# Patient Record
Sex: Male | Born: 1945
Health system: Southern US, Community
[De-identification: ages and names within clinical notes are randomized; demographics above are authoritative.]

## PROBLEM LIST (undated history)

## (undated) DIAGNOSIS — R011 Cardiac murmur, unspecified: Secondary | ICD-10-CM

## (undated) DIAGNOSIS — E785 Hyperlipidemia, unspecified: Secondary | ICD-10-CM

## (undated) DIAGNOSIS — M1990 Unspecified osteoarthritis, unspecified site: Secondary | ICD-10-CM

## (undated) DIAGNOSIS — N529 Male erectile dysfunction, unspecified: Secondary | ICD-10-CM

## (undated) DIAGNOSIS — I2510 Atherosclerotic heart disease of native coronary artery without angina pectoris: Secondary | ICD-10-CM

## (undated) DIAGNOSIS — H269 Unspecified cataract: Secondary | ICD-10-CM

## (undated) DIAGNOSIS — J302 Other seasonal allergic rhinitis: Secondary | ICD-10-CM

## (undated) DIAGNOSIS — M19011 Primary osteoarthritis, right shoulder: Secondary | ICD-10-CM

## (undated) DIAGNOSIS — M19012 Primary osteoarthritis, left shoulder: Secondary | ICD-10-CM

## (undated) DIAGNOSIS — I10 Essential (primary) hypertension: Secondary | ICD-10-CM

## (undated) HISTORY — DX: Atherosclerotic heart disease of native coronary artery without angina pectoris: I25.10

## (undated) HISTORY — DX: Essential (primary) hypertension: I10

## (undated) HISTORY — DX: Male erectile dysfunction, unspecified: N52.9

## (undated) HISTORY — DX: Other seasonal allergic rhinitis: J30.2

## (undated) HISTORY — DX: Unspecified osteoarthritis, unspecified site: M19.90

## (undated) HISTORY — DX: Hyperlipidemia, unspecified: E78.5

## (undated) HISTORY — DX: Primary osteoarthritis, right shoulder: M19.012

## (undated) HISTORY — DX: Primary osteoarthritis, left shoulder: M19.011

## (undated) HISTORY — DX: Unspecified cataract: H26.9

## (undated) HISTORY — PX: HERNIA REPAIR: SHX51

## (undated) HISTORY — DX: Cardiac murmur, unspecified: R01.1

## (undated) HISTORY — PX: POLYPECTOMY: SHX149

---

## 1995-08-22 HISTORY — PX: HERNIA REPAIR: SHX51

## 2006-08-17 ENCOUNTER — Ambulatory Visit: Payer: Self-pay | Admitting: Gastroenterology

## 2006-08-31 ENCOUNTER — Ambulatory Visit: Payer: Self-pay | Admitting: Gastroenterology

## 2009-09-03 ENCOUNTER — Encounter (INDEPENDENT_AMBULATORY_CARE_PROVIDER_SITE_OTHER): Payer: Self-pay | Admitting: *Deleted

## 2009-10-06 ENCOUNTER — Encounter (INDEPENDENT_AMBULATORY_CARE_PROVIDER_SITE_OTHER): Payer: Self-pay | Admitting: *Deleted

## 2009-12-09 ENCOUNTER — Encounter (INDEPENDENT_AMBULATORY_CARE_PROVIDER_SITE_OTHER): Payer: Self-pay | Admitting: *Deleted

## 2009-12-13 ENCOUNTER — Ambulatory Visit: Payer: Self-pay | Admitting: Internal Medicine

## 2009-12-17 ENCOUNTER — Ambulatory Visit: Payer: Self-pay | Admitting: Internal Medicine

## 2009-12-20 ENCOUNTER — Encounter: Payer: Self-pay | Admitting: Internal Medicine

## 2010-09-20 NOTE — Letter (Signed)
Summary: Moviprep Instructions  [facility]  [address]   [place], [place] [address]   Phone: [phone]  Fax: [phone]       Carlos [name]    Dec 01, 1945    MRN: [id number]        Procedure Day Dorna Bloom: Friday, 12-17-09     Arrival Time: 8:30 a.m.      Procedure Time: 9:30 a.m.     Location of Procedure:                    x   [facility] (4th Floor)   PREPARATION FOR COLONOSCOPY WITH MOVIPREP   Starting 5 days prior to your procedure 12-12-09  do not eat nuts, seeds, popcorn, corn, beans, peas,  salads, or any raw vegetables.  Do not take any fiber supplements (e.g. Metamucil, Citrucel, and Benefiber).  THE DAY BEFORE YOUR PROCEDURE         DATE: 12-16-09   DAY: Thursday  1.  Drink clear liquids the entire day-NO SOLID FOOD  2.  Do not drink anything colored red or purple.  Avoid juices with pulp.  No orange juice.  3.  Drink at least 64 oz. (8 glasses) of fluid/clear liquids during the day to prevent dehydration and help the prep work efficiently.  CLEAR LIQUIDS INCLUDE: Water Jello Ice Popsicles Tea (sugar ok, no milk/cream) Powdered fruit flavored drinks Coffee (sugar ok, no milk/cream) Gatorade Juice: apple, white grape, white cranberry  Lemonade Clear bullion, consomm, broth Carbonated beverages (any kind) Strained chicken noodle soup Hard Candy                             4.  In the morning, mix first dose of MoviPrep solution:    Empty 1 Pouch A and 1 Pouch B into the disposable container    Add lukewarm drinking water to the top line of the container. Mix to dissolve    Refrigerate (mixed solution should be used within 24 hrs)  5.  Begin drinking the prep at 5:00 p.m. The MoviPrep container is divided by 4 marks.   Every 15 minutes drink the solution down to the next mark (approximately 8 oz) until the full liter is complete.   6.  Follow completed prep with 16 oz of clear liquid of your choice (Nothing red or  purple).  Continue to drink clear liquids until bedtime.  7.  Before going to bed, mix second dose of MoviPrep solution:    Empty 1 Pouch A and 1 Pouch B into the disposable container    Add lukewarm drinking water to the top line of the container. Mix to dissolve    Refrigerate  THE DAY OF YOUR PROCEDURE      DATE: 12-17-09   DAY: Friday  Beginning at 4:30 a.m. (5 hours before procedure):         1. Every 15 minutes, drink the solution down to the next mark (approx 8 oz) until the full liter is complete.  2. Follow completed prep with 16 oz. of clear liquid of your choice.    3. You may drink clear liquids until 7:30 a.m.   (2 HOURS BEFORE PROCEDURE).   MEDICATION INSTRUCTIONS  Unless otherwise instructed, you should take regular prescription medications with a small sip of water   as early as possible the morning of your procedure.           OTHER INSTRUCTIONS  You will need a responsible adult at least 65 years of age to accompany you and drive you home.   This person must remain in the waiting room during your procedure.  Wear loose fitting clothing that is easily removed.  Leave jewelry and other valuables at home.  However, you may wish to bring a book to read or  an iPod/MP3 player to listen to music as you wait for your procedure to start.  Remove all body piercing jewelry and leave at home.  Total time from sign-in until discharge is approximately 2-3 hours.  You should go home directly after your procedure and rest.  You can resume normal activities the  day after your procedure.  The day of your procedure you should not:   Drive   Make legal decisions   Operate machinery   Drink alcohol   Return to work  You will receive specific instructions about eating, activities and medications before you leave.    The above instructions have been reviewed and explained to me by   Ezra Sites RN  December 13, 2009 9:00 AM     I fully understand and can  verbalize these instructions _____________________________ Date _________

## 2010-09-20 NOTE — Letter (Signed)
Summary: Colonoscopy Letter  [facility]  [address] [place], [place] [address]   Phone: [phone]  Fax: [phone]      September 03, 2009 MRN: [id number]   Carlos [name] [address] [place], [place]  [address]   Dear [name],   According to your medical record, it is time for you to schedule a Colonoscopy. The American Cancer Society recommends this procedure as a method to detect early colon cancer. Patients with a family history of colon cancer, or a personal history of colon polyps or inflammatory bowel disease are at increased risk.  This letter has beeen generated based on the recommendations made at the time of your procedure. If you feel that in your particular situation this may no longer apply, please contact our office.  Please call our office at [phone] to schedule this appointment or to update your records at your earliest convenience.  Thank you for cooperating with [place] to provide you with the very best care possible.   Sincerely,  Wilhemina Bonito. Marina Goodell, M.D.  [facility] [phone]

## 2010-09-20 NOTE — Letter (Signed)
Summary: Patient Notice- Polyp Results  [facility]  [address] [place], [place] [address]   Phone: [phone]  Fax: [phone]        Dec 20, 2009 MRN: [id number]    Carlos [name] [address] [place], [place]  [address]    Dear Mr. Villalona,  I am pleased to inform you that the colon polyp(s) removed during your recent colonoscopy was (were) found to be benign (no cancer detected) upon pathologic examination.  I recommend you have a repeat colonoscopy examination in 3 years to look for recurrent polyps, as having colon polyps increases your risk for having recurrent polyps or even colon cancer in the future.  Should you develop new or worsening symptoms of abdominal pain, bowel habit changes or bleeding from the rectum or bowels, please schedule an evaluation with either your primary care physician or with me.  Additional information/recommendations:  __ No further action with gastroenterology is needed at this time. Please      follow-up with your primary care physician for your other healthcare      needs.   Please call us if you are having persistent problems or have questions about your condition that have not been fully answered at this time.  Sincerely,  Hilarie Fredrickson MD  This letter has been electronically signed by your physician.  Appended Document: Patient Notice- Polyp Results letter mailed 5.5.11

## 2010-09-20 NOTE — Miscellaneous (Signed)
Summary: [facility]  Clinical Lists Changes  Medications: Added new medication of MOVIPREP 100 GM  SOLR (PEG-KCL-NACL-NASULF-NA ASC-C) As per prep instructions. - Signed Rx of MOVIPREP 100 GM  SOLR (PEG-KCL-NACL-NASULF-NA ASC-C) As per prep instructions.;  #1 x 0;  Signed;  Entered by: Ezra Sites RN;  Authorized by: Hilarie Fredrickson MD;  Method used: Electronically to [facility] [phone] [place], [place], [place]  [address], Ph: [phone], Fax: [phone] Observations: Added new observation of NKA: T (12/13/2009 8:29)    Prescriptions: MOVIPREP 100 GM  SOLR (PEG-KCL-NACL-NASULF-NA ASC-C) As per prep instructions.  #1 x 0   Entered by:   Ezra Sites RN   Authorized by:   Hilarie Fredrickson MD   Signed by:   Ezra Sites RN on 12/13/2009   Method used:   Electronically to        [facility] [address] (retail)       [phone]       [place], [place]  [address]       Ph: [phone]       Fax: [phone]   RxID:   [id number]

## 2010-09-20 NOTE — Procedures (Signed)
Summary: Colonoscopy  Patient: Claire Bridge Note: All result statuses are Final unless otherwise noted.  Tests: (1) Colonoscopy (COL)   COL Colonoscopy           DONE     [facility]     [address]     [place], [place]  [address]           COLONOSCOPY PROCEDURE REPORT           PATIENT:  Marcellas, Marchant  MR#:  [id number]     BIRTHDATE:  [birth year], 64 yrs. old  GENDER:  male     ENDOSCOPIST:  Wilhemina Bonito. Eda Keys, MD     REF. BY:  Surveillance Program Recall,     PROCEDURE DATE:  12/17/2009     PROCEDURE:  Colonoscopy with snare polypectomy x 3     ASA CLASS:  Class I     INDICATIONS:  history of pre-cancerous (adenomatous) colon polyps     (7mm adenoma 08-2006), surveillance and high-risk screening     MEDICATIONS:   Fentanyl 75 mcg IV, Versed 7 mg IV           DESCRIPTION OF PROCEDURE:   After the risks benefits and     alternatives of the procedure were thoroughly explained, informed     consent was obtained.  Digital rectal exam was performed and     revealed no abnormalities.   The LB CF-[id number] endoscope     was introduced through the anus and advanced to the cecum, which     was identified by both the appendix and ileocecal valve, without     limitations.Time to cecum = 2:52 min.The quality of the prep was     good, using MoviPrep.  The instrument was then slowly withdrawn     (time = 16:29 min) as the colon was fully examined.     <<PROCEDUREIMAGES>>           FINDINGS:  Three adenomatous appearring polyps were found -     23mm,3mm in the ascending colon and 3mm in the transverse colon.     Polyps were snared without cautery. Retrieval was successful.     Moderate diverticulosis was found in the left colon.Otherwise normal     colonoscopy.   Retroflexed views in the rectum revealed internal     hemorrhoids.    The scope was then withdrawn from the patient and     the procedure completed.           COMPLICATIONS:  None     ENDOSCOPIC IMPRESSION:     1)  Three polyps - removed     2) Moderate diverticulosis in the left colon     3) Internal hemorrhoids           RECOMMENDATIONS:     1) Follow up colonoscopy in 3 years           ______________________________     Wilhemina Bonito. Eda Keys, MD           CC:  The Patient; Guerry Bruin, MD           n.     eSIGNED:   Wilhemina Bonito. Eda Keys at 12/17/2009 10:50 AM           Delrae Sawyers, [id number]  Note: An exclamation mark (!) indicates a result that was not dispersed into the flowsheet. Document Creation Date: 12/17/2009 10:51 AM _______________________________________________________________________  (1) Order result status: Final Collection  or observation date-time: 12/17/2009 10:42 Requested date-time:  Receipt date-time:  Reported date-time:  Referring Physician:   Ordering Physician: Fransico Setters [phone]) Specimen Source:  Source: Launa Grill Order Number: [id number] Lab site:   Appended Document: Colonoscopy recall in 3 yrs/11-2012/aw     Procedures Next Due Date:    Colonoscopy: 11/2012

## 2010-09-20 NOTE — Letter (Signed)
Summary: Previsit letter  [facility]  [address] [place], [place] [address]   Phone: [phone]  Fax: [phone]       10/06/2009 MRN: [id number]  Carlos [name] [address] [place], [place]  [address]  Dear Mr. Thorpe,  Welcome to the Gastroenterology Division at [facility].    You are scheduled to see a nurse for your pre-procedure visit on 12-13-09 at 8:30am on the 3rd floor at [facility], [address]  We ask that you try to arrive at our office 15 minutes prior to your appointment time to allow for check-in.  Your nurse visit will consist of discussing your medical and surgical history, your immediate family medical history, and your medications.    Please bring a complete list of all your medications or, if you prefer, bring the medication bottles and we will list them.  We will need to be aware of both prescribed and over the counter drugs.  We will need to know exact dosage information as well.  If you are on blood thinners (Coumadin, Plavix, Aggrenox, Ticlid, etc.) please call our office today/prior to your appointment, as we need to consult with your physician about holding your medication.   Please be prepared to read and sign documents such as consent forms, a financial agreement, and acknowledgement forms.  If necessary, and with your consent, a friend or relative is welcome to sit-in on the nurse visit with you.  Please bring your insurance card so that we may make a copy of it.  If your insurance requires a referral to see a specialist, please bring your referral form from your primary care physician.  No co-pay is required for this nurse visit.     If you cannot keep your appointment, please call [phone] to cancel or reschedule prior to your appointment date.  This allows [place] the opportunity to schedule an appointment for another patient in need of care.    Thank you for choosing [facility] for your medical  needs.  We appreciate the opportunity to care for you.  Please visit [place] at our website  to learn more about our practice.                     Sincerely.                                                                                                                   The Gastroenterology Division

## 2011-07-31 ENCOUNTER — Encounter: Payer: Self-pay | Admitting: Emergency Medicine

## 2011-07-31 ENCOUNTER — Emergency Department (INDEPENDENT_AMBULATORY_CARE_PROVIDER_SITE_OTHER): Payer: 59

## 2011-07-31 ENCOUNTER — Emergency Department (HOSPITAL_COMMUNITY)
Admission: EM | Admit: 2011-07-31 | Discharge: 2011-07-31 | Disposition: A | Discharge status: Home / Self Care | Payer: 59 | Source: Home / Self Care | Attending: Emergency Medicine | Admitting: Emergency Medicine

## 2011-07-31 DIAGNOSIS — S42293A Other displaced fracture of upper end of unspecified humerus, initial encounter for closed fracture: Secondary | ICD-10-CM

## 2011-07-31 MED ORDER — HYDROCODONE-ACETAMINOPHEN 5-325 MG PO TABS: ORAL_TABLET | ORAL | Status: AC

## 2011-07-31 MED ORDER — DICLOFENAC SODIUM 75 MG PO TBEC: 75.0000 mg | DELAYED_RELEASE_TABLET | Freq: Two times a day (BID) | ORAL | Status: AC

## 2011-07-31 MED ORDER — HYDROCODONE-ACETAMINOPHEN 5-325 MG PO TABS
ORAL_TABLET | ORAL | Status: AC
  Filled 2011-07-31: qty 1

## 2011-07-31 MED ORDER — HYDROCODONE-ACETAMINOPHEN 5-325 MG PO TABS
1.0000 | ORAL_TABLET | Freq: Once | ORAL | Status: AC
  Administered 2011-07-31: 1 via ORAL

## 2011-07-31 NOTE — ED Notes (Signed)
Pt here with right shoulder pain and knot to right head and few abrasions to r hand s/p fall today @ 230.[place] the post office [place] car pulled in and he fell to the ground.denies loc or vomiting.ice applied on admission

## 2011-07-31 NOTE — ED Provider Notes (Signed)
History     CSN: [id number] Arrival date & time: 07/31/2011  6:57 PM   First MD Initiated Contact with Patient 07/31/11 1824      Chief Complaint  Patient presents with  . Shoulder Injury  . Fall  . Head Injury    (Consider location/radiation/quality/duration/timing/severity/associated sxs/prior treatment) HPI Comments: Carlos [name] [place] the post office [place] when he tripped over a car tire and fell on the pavement striking his right shoulder. He also hit his head and his right wrist and there was no loss of consciousness. He has a small abrasion on his right forehead. He denies headaches, diplopia, blurred vision, bleeding from the nose or ears, or any other neurological symptoms. He denies any neck pain and his neck has a full range of motion. He has pain in his right shoulder and its painful with movement. There is no numbness in the arm. He has abrasion over his right radial styloid and over the dorsum of the hand as well but is able to move his wrist and all his fingers without any pain.  Patient is a 65 y.o. male presenting with shoulder injury, fall, and head injury.  Shoulder Injury  Fall Pertinent negatives include no numbness.  Head Injury  Pertinent negatives include no numbness and no weakness.    History reviewed. No pertinent past medical history.  Past Surgical History  Procedure Date  . Hernia repair     History reviewed. No pertinent family history.  History  Substance Use Topics  . Smoking status: Never Smoker   . Smokeless tobacco: Not on file  . Alcohol Use:       Review of Systems  Musculoskeletal: Positive for arthralgias. Negative for myalgias, back pain, joint swelling and gait problem.  Skin: Negative for rash and wound.  Neurological: Negative for weakness and numbness.    Allergies  Review of patient's allergies indicates no known allergies.  Home Medications   Current Outpatient Rx  Name Route Sig Dispense Refill  . DICLOFENAC  SODIUM 75 MG PO TBEC Oral Take 1 tablet (75 mg total) by mouth 2 (two) times daily. 20 tablet 0  . HYDROCODONE-ACETAMINOPHEN 5-325 MG PO TABS  1 to 2 tabs every 4 to 6 hours as needed for pain. 20 tablet 0    BP 155/94  Pulse 106  Temp(Src) 99.3 F (37.4 C) (Oral)  Resp 18  SpO2 99%  Physical Exam  Nursing note and vitals reviewed. Constitutional: He is oriented to person, place, and time. He appears well-developed and well-nourished. No distress.  HENT:  Left Ear: External ear normal.  Nose: Nose normal.  Mouth/Throat: Oropharynx is clear and moist.       Exam of his head reveals an abrasion over the right frontal area. This was mildly tender to palpation. Pupils are equal, round, react to light and he has a full range EOMs.  Eyes: Conjunctivae and EOM are normal. Pupils are equal, round, and reactive to light.  Neck: Normal range of motion. Neck supple.  Musculoskeletal: He exhibits tenderness. He exhibits no edema.       Exam of the shoulder reveals no swelling, bruising, or deformity. There is minimal pain to palpation. He shoulder has a decreased range of motion with pain on movement and he has some abrasions over his right ulnar styloid in the dorsum of the hand but these are very minimal. The wrist fingers all have full range of motion with no pain.  Neurological: He is alert and  oriented to person, place, and time. He has normal strength and normal reflexes. He displays no atrophy and normal reflexes. No cranial nerve deficit or sensory deficit. He exhibits normal muscle tone. Coordination normal.  Skin: Skin is warm and dry. No rash noted. He is not diaphoretic.    ED Course  Procedures (including critical care time)  Labs Reviewed - No data to display Dg Shoulder Right  07/31/2011  *RADIOLOGY REPORT*  Clinical Data: Fall.  Pain.  RIGHT SHOULDER - 2+ VIEW  Comparison: None.  Findings: Comminuted fracture of the right humeral head extending to the right surgical neck.   IMPRESSION: Comminuted fracture of the right humeral head extending to the right surgical neck.  Original Report Authenticated By: Fuller Canada, M.D.   Dg Wrist Complete Right  07/31/2011  *RADIOLOGY REPORT*  Clinical Data: Larey Seat on right side.  Pain right wrist with abrasion on the ulnar side of the right wrist.  RIGHT WRIST - COMPLETE 3+ VIEW  Comparison: None.  Findings: The carpals are located.  No acute fracture is identified.  There is focal soft tissue swelling adjacent to the distal ulna.  IMPRESSION:  1.  No acute fracture identified. 2.  Focal soft tissue swelling adjacent to the distal ulna.  Original Report Authenticated By: Britta Mccreedy, M.D.     1. Humeral head fracture       MDM  He has a right radial head fracture. He was put in a shoulder immobilizer and told to followup with Dr. due to in 2 days.        Roque Lias, MD 07/31/11 2116

## 2013-01-09 ENCOUNTER — Encounter: Payer: Self-pay | Admitting: Internal Medicine

## 2013-03-05 ENCOUNTER — Encounter: Payer: Self-pay | Admitting: Internal Medicine

## 2013-04-29 ENCOUNTER — Ambulatory Visit (AMBULATORY_SURGERY_CENTER): Payer: Self-pay | Admitting: *Deleted

## 2013-04-29 VITALS — Ht 73.0 in | Wt 249.0 lb

## 2013-04-29 DIAGNOSIS — Z86010 Personal history of colonic polyps: Secondary | ICD-10-CM

## 2013-04-29 MED ORDER — MOVIPREP 100 G PO SOLR: 1.0000 | Freq: Once | ORAL | Status: DC

## 2013-04-29 NOTE — Progress Notes (Signed)
Denies allergies to eggs or soy products. Denies complications with anesthesia or sedation. 

## 2013-04-30 ENCOUNTER — Encounter: Payer: Self-pay | Admitting: Internal Medicine

## 2013-05-13 ENCOUNTER — Ambulatory Visit (AMBULATORY_SURGERY_CENTER): Payer: Medicare Other | Admitting: Internal Medicine

## 2013-05-13 ENCOUNTER — Encounter: Payer: Self-pay | Admitting: Internal Medicine

## 2013-05-13 VITALS — BP 147/82 | HR 66 | Temp 98.4°F | Resp 21 | Ht 73.0 in | Wt 249.0 lb

## 2013-05-13 DIAGNOSIS — Z86010 Personal history of colonic polyps: Secondary | ICD-10-CM

## 2013-05-13 DIAGNOSIS — D126 Benign neoplasm of colon, unspecified: Secondary | ICD-10-CM

## 2013-05-13 HISTORY — PX: COLONOSCOPY: SHX174

## 2013-05-13 MED ORDER — SODIUM CHLORIDE 0.9 % IV SOLN: 500.0000 mL | INTRAVENOUS | Status: DC

## 2013-05-13 NOTE — Progress Notes (Signed)
Report to pacu RN, vss, bbs=clear 

## 2013-05-13 NOTE — Progress Notes (Signed)
Called to room to assist during endoscopic procedure.  Patient ID and intended procedure confirmed with present staff. Received instructions for my participation in the procedure from the performing physician.  

## 2013-05-13 NOTE — Progress Notes (Signed)
No complaints noted in the recovery room. Maw   

## 2013-05-13 NOTE — Patient Instructions (Addendum)
YOU HAD AN ENDOSCOPIC PROCEDURE TODAY AT THE [facility]: Refer to the procedure report that was given to you for any specific questions about what was found during the examination.  If the procedure report does not answer your questions, please call your gastroenterologist to clarify.  If you requested that your care partner not be given the details of your procedure findings, then the procedure report has been included in a sealed envelope for you to review at your convenience later.  YOU SHOULD EXPECT: Some feelings of bloating in the abdomen. Passage of more gas than usual.  Walking can help get rid of the air that was put into your GI tract during the procedure and reduce the bloating. If you had a lower endoscopy (such as a colonoscopy or flexible sigmoidoscopy) you may notice spotting of blood in your stool or on the toilet paper. If you underwent a bowel prep for your procedure, then you may not have a normal bowel movement for a few days.  DIET: Your first meal following the procedure should be a light meal and then it is ok to progress to your normal diet.  A half-sandwich or bowl of soup is an example of a good first meal.  Heavy or fried foods are harder to digest and may make you feel nauseous or bloated.  Likewise meals heavy in dairy and vegetables can cause extra gas to form and this can also increase the bloating.  Drink plenty of fluids but you should avoid alcoholic beverages for 24 hours.  ACTIVITY: Your care partner should take you home directly after the procedure.  You should plan to take it easy, moving slowly for the rest of the day.  You can resume normal activity the day after the procedure however you should NOT DRIVE or use heavy machinery for 24 hours (because of the sedation medicines used during the test).    SYMPTOMS TO REPORT IMMEDIATELY: A gastroenterologist can be reached at any hour.  During normal business hours, 8:30 AM to 5:00 PM Monday through Friday,  call [phone].  After hours and on weekends, please call the GI answering service at [phone] who will take a message and have the physician on call contact you.   Following lower endoscopy (colonoscopy or flexible sigmoidoscopy):  Excessive amounts of blood in the stool  Significant tenderness or worsening of abdominal pains  Swelling of the abdomen that is new, acute  Fever of 100F or higher   FOLLOW UP: If any biopsies were taken you will be contacted by phone or by letter within the next 1-3 weeks.  Call your gastroenterologist if you have not heard about the biopsies in 3 weeks.  Our staff will call the home number listed on your records the next business day following your procedure to check on you and address any questions or concerns that you may have at that time regarding the information given to you following your procedure. This is a courtesy call and so if there is no answer at the home number and we have not heard from you through the emergency physician on call, we will assume that you have returned to your regular daily activities without incident.  SIGNATURES/CONFIDENTIALITY: You and/or your care partner have signed paperwork which will be entered into your electronic medical record.  These signatures attest to the fact that that the information above on your After Visit Summary has been reviewed and is understood.  Full responsibility of the confidentiality of   this discharge information lies with you and/or your care-partner.    Handouts were given to your care partner on polyps, diverticulosis and a high fiber diet. Lamisil cream (OTC) twice daily to buttock rash per Dr. Marina Goodell. You may resume your current medications today. Please call if any questions or concerns.

## 2013-05-13 NOTE — Op Note (Signed)
[facility] [address] [place], [address]   COLONOSCOPY PROCEDURE REPORT  PATIENT: Carlos [name], Carlos [name]  MR#: [id number] BIRTHDATE: 24-Feb-1946 , 67  yrs. old GENDER: Male ENDOSCOPIST: Roxy Cedar, MD REFERRED [id number] Program Recall PROCEDURE DATE:  05/13/2013 PROCEDURE:   Colonoscopy with snare polypectomy x 2 First Screening Colonoscopy - Avg.  risk and is 50 yrs.  old or older - No.  Prior Negative Screening - Now for repeat screening. N/A  History of Adenoma - Now for follow-up colonoscopy & has been > or = to 3 yrs.  Yes hx of adenoma.  Has been 3 or more years since last colonoscopy.  Polyps Removed Today? Yes. ASA CLASS:   Class II INDICATIONS:Patient's personal history of adenomatous colon polyps. Index 2006 (7mm TA); 11-2009 (TA x 3) MEDICATIONS: MAC sedation, administered by CRNA and propofol (Diprivan) 300mg  IV  DESCRIPTION OF PROCEDURE:   After the risks benefits and alternatives of the procedure were thoroughly explained, informed consent was obtained.  A digital rectal exam revealed no abnormalities of the rectum.   The LB [id number]  endoscope was introduced through the anus and advanced to the cecum, which was identified by both the appendix and ileocecal valve. No adverse events experienced.   The quality of the prep was excellent, using MoviPrep  The instrument was then slowly withdrawn as the colon was fully examined.   COLON FINDINGS: Two diminutive polyps were found in the transverse colon.  A polypectomy was performed with a cold snare.  The resection was complete and the polyp tissue was completely retrieved.   Moderate diverticulosis was noted in the sigmoid colon.   The colon mucosa was otherwise normal.  Retroflexed views revealed internal hemorrhoids. The time to cecum=5 minutes 46 seconds.  Withdrawal time=10 minutes 31 seconds.  The scope was withdrawn and the procedure completed. COMPLICATIONS: There were no  complications.  ENDOSCOPIC IMPRESSION: 1.   Two diminutive polyps were found in the transverse colon; polypectomy was performed with a cold snare 2.   Moderate diverticulosis was noted in the sigmoid colon 3.   The colon mucosa was otherwise normal  RECOMMENDATIONS: 1. Follow up colonoscopy in 5 years 2. Lamisil cream (can obtain OTC) twice daily to buttock rash   eSigned:  Roxy Cedar, MD 05/13/2013 9:51 AM   cc: Guerry Bruin, MD and The Patient   PATIENT NAME:  Carlos [name], Carlos [name] MR#: [id number]

## 2013-05-14 ENCOUNTER — Telehealth: Payer: Self-pay | Admitting: *Deleted

## 2013-05-14 NOTE — Telephone Encounter (Signed)
  Follow up Call-  Call back number 05/13/2013  Post procedure Call Back phone  # [phone]  Permission to leave phone message Yes     Patient questions:  Do you have a fever, pain , or abdominal swelling? no Pain Score  0 *  Have you tolerated food without any problems? yes  Have you been able to return to your normal activities? yes  Do you have any questions about your discharge instructions: Diet   no Medications  no Follow up visit  no  Do you have questions or concerns about your Care? no  Actions: * If pain score is 4 or above: No action needed, pain <4.

## 2013-05-19 ENCOUNTER — Encounter: Payer: Self-pay | Admitting: Internal Medicine

## 2014-11-03 ENCOUNTER — Other Ambulatory Visit: Payer: Self-pay | Admitting: Physician Assistant

## 2015-07-06 DIAGNOSIS — Z23 Encounter for immunization: Secondary | ICD-10-CM | POA: Diagnosis not present

## 2015-08-03 DIAGNOSIS — E785 Hyperlipidemia, unspecified: Secondary | ICD-10-CM | POA: Diagnosis not present

## 2015-08-03 DIAGNOSIS — Z0000 Encounter for general adult medical examination without abnormal findings: Secondary | ICD-10-CM | POA: Diagnosis not present

## 2015-08-03 DIAGNOSIS — E7800 Pure hypercholesterolemia, unspecified: Secondary | ICD-10-CM | POA: Diagnosis not present

## 2015-08-10 ENCOUNTER — Encounter: Payer: Self-pay | Admitting: Gastroenterology

## 2015-08-10 DIAGNOSIS — E668 Other obesity: Secondary | ICD-10-CM | POA: Diagnosis not present

## 2015-08-10 DIAGNOSIS — D126 Benign neoplasm of colon, unspecified: Secondary | ICD-10-CM | POA: Diagnosis not present

## 2015-08-10 DIAGNOSIS — Z0000 Encounter for general adult medical examination without abnormal findings: Secondary | ICD-10-CM | POA: Diagnosis not present

## 2015-08-10 DIAGNOSIS — M160 Bilateral primary osteoarthritis of hip: Secondary | ICD-10-CM | POA: Diagnosis not present

## 2015-08-10 DIAGNOSIS — R69 Illness, unspecified: Secondary | ICD-10-CM | POA: Diagnosis not present

## 2015-08-10 DIAGNOSIS — Z6832 Body mass index (BMI) 32.0-32.9, adult: Secondary | ICD-10-CM | POA: Diagnosis not present

## 2015-08-10 DIAGNOSIS — E7800 Pure hypercholesterolemia, unspecified: Secondary | ICD-10-CM | POA: Diagnosis not present

## 2015-08-10 DIAGNOSIS — Z1389 Encounter for screening for other disorder: Secondary | ICD-10-CM | POA: Diagnosis not present

## 2015-08-18 DIAGNOSIS — Z1212 Encounter for screening for malignant neoplasm of rectum: Secondary | ICD-10-CM | POA: Diagnosis not present

## 2015-10-20 DIAGNOSIS — H11442 Conjunctival cysts, left eye: Secondary | ICD-10-CM | POA: Diagnosis not present

## 2015-11-15 DIAGNOSIS — R69 Illness, unspecified: Secondary | ICD-10-CM | POA: Diagnosis not present

## 2015-12-28 DIAGNOSIS — H25013 Cortical age-related cataract, bilateral: Secondary | ICD-10-CM | POA: Diagnosis not present

## 2015-12-28 DIAGNOSIS — Z0100 Encounter for examination of eyes and vision without abnormal findings: Secondary | ICD-10-CM | POA: Diagnosis not present

## 2016-01-04 DIAGNOSIS — R69 Illness, unspecified: Secondary | ICD-10-CM | POA: Diagnosis not present

## 2016-05-24 ENCOUNTER — Other Ambulatory Visit: Payer: Self-pay | Admitting: Physician Assistant

## 2016-05-24 DIAGNOSIS — L820 Inflamed seborrheic keratosis: Secondary | ICD-10-CM | POA: Diagnosis not present

## 2016-05-24 DIAGNOSIS — D485 Neoplasm of uncertain behavior of skin: Secondary | ICD-10-CM | POA: Diagnosis not present

## 2016-05-24 DIAGNOSIS — L821 Other seborrheic keratosis: Secondary | ICD-10-CM | POA: Diagnosis not present

## 2016-05-24 DIAGNOSIS — L432 Lichenoid drug reaction: Secondary | ICD-10-CM | POA: Diagnosis not present

## 2016-05-31 DIAGNOSIS — J029 Acute pharyngitis, unspecified: Secondary | ICD-10-CM | POA: Diagnosis not present

## 2016-05-31 DIAGNOSIS — Z6831 Body mass index (BMI) 31.0-31.9, adult: Secondary | ICD-10-CM | POA: Diagnosis not present

## 2016-06-01 DIAGNOSIS — Z23 Encounter for immunization: Secondary | ICD-10-CM | POA: Diagnosis not present

## 2016-06-22 ENCOUNTER — Other Ambulatory Visit: Payer: Self-pay | Admitting: Physician Assistant

## 2016-06-22 DIAGNOSIS — D485 Neoplasm of uncertain behavior of skin: Secondary | ICD-10-CM | POA: Diagnosis not present

## 2016-06-22 DIAGNOSIS — L821 Other seborrheic keratosis: Secondary | ICD-10-CM | POA: Diagnosis not present

## 2016-06-22 DIAGNOSIS — L570 Actinic keratosis: Secondary | ICD-10-CM | POA: Diagnosis not present

## 2016-08-04 DIAGNOSIS — E7800 Pure hypercholesterolemia, unspecified: Secondary | ICD-10-CM | POA: Diagnosis not present

## 2016-08-04 DIAGNOSIS — Z125 Encounter for screening for malignant neoplasm of prostate: Secondary | ICD-10-CM | POA: Diagnosis not present

## 2016-08-04 DIAGNOSIS — R8299 Other abnormal findings in urine: Secondary | ICD-10-CM | POA: Diagnosis not present

## 2016-08-11 DIAGNOSIS — Z1389 Encounter for screening for other disorder: Secondary | ICD-10-CM | POA: Diagnosis not present

## 2016-08-11 DIAGNOSIS — M160 Bilateral primary osteoarthritis of hip: Secondary | ICD-10-CM | POA: Diagnosis not present

## 2016-08-11 DIAGNOSIS — D101 Benign neoplasm of tongue: Secondary | ICD-10-CM | POA: Diagnosis not present

## 2016-08-11 DIAGNOSIS — Z6831 Body mass index (BMI) 31.0-31.9, adult: Secondary | ICD-10-CM | POA: Diagnosis not present

## 2016-08-11 DIAGNOSIS — E7800 Pure hypercholesterolemia, unspecified: Secondary | ICD-10-CM | POA: Diagnosis not present

## 2016-08-11 DIAGNOSIS — R69 Illness, unspecified: Secondary | ICD-10-CM | POA: Diagnosis not present

## 2016-08-11 DIAGNOSIS — Z23 Encounter for immunization: Secondary | ICD-10-CM | POA: Diagnosis not present

## 2016-08-11 DIAGNOSIS — R3129 Other microscopic hematuria: Secondary | ICD-10-CM | POA: Diagnosis not present

## 2016-08-11 DIAGNOSIS — Z0000 Encounter for general adult medical examination without abnormal findings: Secondary | ICD-10-CM | POA: Diagnosis not present

## 2016-08-11 DIAGNOSIS — E668 Other obesity: Secondary | ICD-10-CM | POA: Diagnosis not present

## 2016-08-11 DIAGNOSIS — J3089 Other allergic rhinitis: Secondary | ICD-10-CM | POA: Diagnosis not present

## 2016-08-16 DIAGNOSIS — Z1212 Encounter for screening for malignant neoplasm of rectum: Secondary | ICD-10-CM | POA: Diagnosis not present

## 2016-09-12 DIAGNOSIS — D101 Benign neoplasm of tongue: Secondary | ICD-10-CM | POA: Diagnosis not present

## 2016-09-12 DIAGNOSIS — R69 Illness, unspecified: Secondary | ICD-10-CM | POA: Diagnosis not present

## 2016-09-19 DIAGNOSIS — D101 Benign neoplasm of tongue: Secondary | ICD-10-CM | POA: Diagnosis not present

## 2016-11-01 DIAGNOSIS — H02055 Trichiasis without entropian left lower eyelid: Secondary | ICD-10-CM | POA: Diagnosis not present

## 2016-11-01 DIAGNOSIS — H25013 Cortical age-related cataract, bilateral: Secondary | ICD-10-CM | POA: Diagnosis not present

## 2016-11-01 DIAGNOSIS — H2513 Age-related nuclear cataract, bilateral: Secondary | ICD-10-CM | POA: Diagnosis not present

## 2016-11-01 DIAGNOSIS — H524 Presbyopia: Secondary | ICD-10-CM | POA: Diagnosis not present

## 2017-05-19 DIAGNOSIS — Z23 Encounter for immunization: Secondary | ICD-10-CM | POA: Diagnosis not present

## 2017-08-31 DIAGNOSIS — E7800 Pure hypercholesterolemia, unspecified: Secondary | ICD-10-CM | POA: Diagnosis not present

## 2017-08-31 DIAGNOSIS — R82998 Other abnormal findings in urine: Secondary | ICD-10-CM | POA: Diagnosis not present

## 2017-08-31 DIAGNOSIS — Z125 Encounter for screening for malignant neoplasm of prostate: Secondary | ICD-10-CM | POA: Diagnosis not present

## 2017-09-06 DIAGNOSIS — Z0000 Encounter for general adult medical examination without abnormal findings: Secondary | ICD-10-CM | POA: Diagnosis not present

## 2017-09-06 DIAGNOSIS — R69 Illness, unspecified: Secondary | ICD-10-CM | POA: Diagnosis not present

## 2017-09-06 DIAGNOSIS — Z6833 Body mass index (BMI) 33.0-33.9, adult: Secondary | ICD-10-CM | POA: Diagnosis not present

## 2017-09-06 DIAGNOSIS — E7800 Pure hypercholesterolemia, unspecified: Secondary | ICD-10-CM | POA: Diagnosis not present

## 2017-09-06 DIAGNOSIS — M160 Bilateral primary osteoarthritis of hip: Secondary | ICD-10-CM | POA: Diagnosis not present

## 2017-09-06 DIAGNOSIS — E668 Other obesity: Secondary | ICD-10-CM | POA: Diagnosis not present

## 2017-09-06 DIAGNOSIS — D126 Benign neoplasm of colon, unspecified: Secondary | ICD-10-CM | POA: Diagnosis not present

## 2017-09-06 DIAGNOSIS — J3089 Other allergic rhinitis: Secondary | ICD-10-CM | POA: Diagnosis not present

## 2017-09-06 DIAGNOSIS — Z1389 Encounter for screening for other disorder: Secondary | ICD-10-CM | POA: Diagnosis not present

## 2017-09-14 DIAGNOSIS — Z1212 Encounter for screening for malignant neoplasm of rectum: Secondary | ICD-10-CM | POA: Diagnosis not present

## 2017-11-06 DIAGNOSIS — H2513 Age-related nuclear cataract, bilateral: Secondary | ICD-10-CM | POA: Diagnosis not present

## 2017-11-06 DIAGNOSIS — H52203 Unspecified astigmatism, bilateral: Secondary | ICD-10-CM | POA: Diagnosis not present

## 2017-11-06 DIAGNOSIS — H25013 Cortical age-related cataract, bilateral: Secondary | ICD-10-CM | POA: Diagnosis not present

## 2017-11-06 DIAGNOSIS — H5213 Myopia, bilateral: Secondary | ICD-10-CM | POA: Diagnosis not present

## 2018-03-28 DIAGNOSIS — R69 Illness, unspecified: Secondary | ICD-10-CM | POA: Diagnosis not present

## 2018-04-01 DIAGNOSIS — R69 Illness, unspecified: Secondary | ICD-10-CM | POA: Diagnosis not present

## 2018-05-18 DIAGNOSIS — Z23 Encounter for immunization: Secondary | ICD-10-CM | POA: Diagnosis not present

## 2018-07-30 ENCOUNTER — Other Ambulatory Visit: Payer: Self-pay | Admitting: Dermatology

## 2018-07-30 DIAGNOSIS — D485 Neoplasm of uncertain behavior of skin: Secondary | ICD-10-CM | POA: Diagnosis not present

## 2018-07-30 DIAGNOSIS — L820 Inflamed seborrheic keratosis: Secondary | ICD-10-CM | POA: Diagnosis not present

## 2018-07-30 DIAGNOSIS — L309 Dermatitis, unspecified: Secondary | ICD-10-CM | POA: Diagnosis not present

## 2018-07-30 DIAGNOSIS — D229 Melanocytic nevi, unspecified: Secondary | ICD-10-CM | POA: Diagnosis not present

## 2018-07-30 DIAGNOSIS — L219 Seborrheic dermatitis, unspecified: Secondary | ICD-10-CM | POA: Diagnosis not present

## 2018-09-05 DIAGNOSIS — R82998 Other abnormal findings in urine: Secondary | ICD-10-CM | POA: Diagnosis not present

## 2018-09-05 DIAGNOSIS — Z125 Encounter for screening for malignant neoplasm of prostate: Secondary | ICD-10-CM | POA: Diagnosis not present

## 2018-09-05 DIAGNOSIS — E7800 Pure hypercholesterolemia, unspecified: Secondary | ICD-10-CM | POA: Diagnosis not present

## 2018-09-12 DIAGNOSIS — E668 Other obesity: Secondary | ICD-10-CM | POA: Diagnosis not present

## 2018-09-12 DIAGNOSIS — R69 Illness, unspecified: Secondary | ICD-10-CM | POA: Diagnosis not present

## 2018-09-12 DIAGNOSIS — M160 Bilateral primary osteoarthritis of hip: Secondary | ICD-10-CM | POA: Diagnosis not present

## 2018-09-12 DIAGNOSIS — D126 Benign neoplasm of colon, unspecified: Secondary | ICD-10-CM | POA: Diagnosis not present

## 2018-09-12 DIAGNOSIS — Z1331 Encounter for screening for depression: Secondary | ICD-10-CM | POA: Diagnosis not present

## 2018-09-12 DIAGNOSIS — J3089 Other allergic rhinitis: Secondary | ICD-10-CM | POA: Diagnosis not present

## 2018-09-12 DIAGNOSIS — Z1339 Encounter for screening examination for other mental health and behavioral disorders: Secondary | ICD-10-CM | POA: Diagnosis not present

## 2018-09-12 DIAGNOSIS — H6121 Impacted cerumen, right ear: Secondary | ICD-10-CM | POA: Diagnosis not present

## 2018-09-12 DIAGNOSIS — Z0000 Encounter for general adult medical examination without abnormal findings: Secondary | ICD-10-CM | POA: Diagnosis not present

## 2018-09-12 DIAGNOSIS — E7800 Pure hypercholesterolemia, unspecified: Secondary | ICD-10-CM | POA: Diagnosis not present

## 2018-09-13 DIAGNOSIS — Z1212 Encounter for screening for malignant neoplasm of rectum: Secondary | ICD-10-CM | POA: Diagnosis not present

## 2018-10-07 DIAGNOSIS — R69 Illness, unspecified: Secondary | ICD-10-CM | POA: Diagnosis not present

## 2018-10-09 ENCOUNTER — Encounter: Payer: Self-pay | Admitting: Internal Medicine

## 2018-10-21 ENCOUNTER — Ambulatory Visit (AMBULATORY_SURGERY_CENTER): Payer: Self-pay | Admitting: *Deleted

## 2018-10-21 ENCOUNTER — Encounter: Payer: Self-pay | Admitting: Internal Medicine

## 2018-10-21 VITALS — Ht 73.0 in | Wt 250.0 lb

## 2018-10-21 DIAGNOSIS — Z86010 Personal history of colonic polyps: Secondary | ICD-10-CM

## 2018-10-21 MED ORDER — NA SULFATE-K SULFATE-MG SULF 17.5-3.13-1.6 GM/177ML PO SOLN
ORAL | 0 refills | Status: DC
Start: 2018-10-21 — End: 2018-11-04

## 2018-10-21 NOTE — Progress Notes (Signed)
Patient denies any allergies to eggs or soy. Patient denies any problems with anesthesia/sedation. Patient denies any oxygen use at home. Patient denies taking any diet/weight loss medications or blood thinners. EMMI education offered, pt declined.  

## 2018-11-04 ENCOUNTER — Other Ambulatory Visit: Payer: Self-pay

## 2018-11-04 ENCOUNTER — Encounter: Payer: Self-pay | Admitting: Internal Medicine

## 2018-11-04 ENCOUNTER — Ambulatory Visit (AMBULATORY_SURGERY_CENTER): Payer: Medicare HMO | Admitting: Internal Medicine

## 2018-11-04 VITALS — BP 134/78 | HR 80 | Resp 16 | Ht 73.0 in | Wt 250.0 lb

## 2018-11-04 DIAGNOSIS — D122 Benign neoplasm of ascending colon: Secondary | ICD-10-CM

## 2018-11-04 DIAGNOSIS — Z86010 Personal history of colonic polyps: Secondary | ICD-10-CM

## 2018-11-04 DIAGNOSIS — Z1211 Encounter for screening for malignant neoplasm of colon: Secondary | ICD-10-CM | POA: Diagnosis not present

## 2018-11-04 MED ORDER — SODIUM CHLORIDE 0.9 % IV SOLN: 500.0000 mL | Freq: Once | INTRAVENOUS | Status: DC

## 2018-11-04 NOTE — Progress Notes (Signed)
Pt's states no medical or surgical changes since previsit or office visit. 

## 2018-11-04 NOTE — Progress Notes (Signed)
No problems noted in the recovery room. maw 

## 2018-11-04 NOTE — Progress Notes (Signed)
A and O x3. Report to RN. Tolerated MAC anesthesia well.

## 2018-11-04 NOTE — Progress Notes (Signed)
Called to room to assist during endoscopic procedure.  Patient ID and intended procedure confirmed with present staff. Received instructions for my participation in the procedure from the performing physician.  

## 2018-11-04 NOTE — Patient Instructions (Signed)
YOU HAD AN ENDOSCOPIC PROCEDURE TODAY AT [facility]:   Refer to the procedure report that was given to you for any specific questions about what was found during the examination.  If the procedure report does not answer your questions, please call your gastroenterologist to clarify.  If you requested that your care partner not be given the details of your procedure findings, then the procedure report has been included in a sealed envelope for you to review at your convenience later.  YOU SHOULD EXPECT: Some feelings of bloating in the abdomen. Passage of more gas than usual.  Walking can help get rid of the air that was put into your GI tract during the procedure and reduce the bloating. If you had a lower endoscopy (such as a colonoscopy or flexible sigmoidoscopy) you may notice spotting of blood in your stool or on the toilet paper. If you underwent a bowel prep for your procedure, you may not have a normal bowel movement for a few days.  Please Note:  You might notice some irritation and congestion in your nose or some drainage.  This is from the oxygen used during your procedure.  There is no need for concern and it should clear up in a day or so.  SYMPTOMS TO REPORT IMMEDIATELY:   Following lower endoscopy (colonoscopy or flexible sigmoidoscopy):  Excessive amounts of blood in the stool  Significant tenderness or worsening of abdominal pains  Swelling of the abdomen that is new, acute  Fever of 100F or higher    For urgent or emergent issues, a gastroenterologist can be reached at any hour by calling [phone].   DIET:  We do recommend a small meal at first, but then you may proceed to your regular diet.  Drink plenty of fluids but you should avoid alcoholic beverages for 24 hours.  ACTIVITY:  You should plan to take it easy for the rest of today and you should NOT DRIVE or use heavy machinery until tomorrow (because of the sedation medicines used during the test).     FOLLOW UP: Our staff will call the number listed on your records the next business day following your procedure to check on you and address any questions or concerns that you may have regarding the information given to you following your procedure. If we do not reach you, we will leave a message.  However, if you are feeling well and you are not experiencing any problems, there is no need to return our call.  We will assume that you have returned to your regular daily activities without incident.  If any biopsies were taken you will be contacted by phone or by letter within the next 1-3 weeks.  Please call us at [phone] if you have not heard about the biopsies in 3 weeks.    SIGNATURES/CONFIDENTIALITY: You and/or your care partner have signed paperwork which will be entered into your electronic medical record.  These signatures attest to the fact that that the information above on your After Visit Summary has been reviewed and is understood.  Full responsibility of the confidentiality of this discharge information lies with you and/or your care-partner.     Handouts were given to your care partner on polyps, diverticulosis, and hemorrhoids. You may resume your current medications today. Await biopsy results. Repeat colonoscopy in 5 years. Please call if any questions or concerns.

## 2018-11-04 NOTE — Op Note (Signed)
[facility] Patient Name: Carlos [name] Procedure Date: 11/04/2018 9:34 AM MRN: [id number] Endoscopist: Docia Chuck. Henrene Pastor , MD Age: 73 Referring MD:  Date of Birth: 02-Apr-1946 Gender: Male Account #: [id number] Procedure:                Colonoscopy with cold snare polypectomy x 2 Indications:              High risk colon cancer surveillance: Personal                            history of multiple (3 or more) adenomas. Previous                            examinations 2006, 2008, 2011, 2014 Medicines:                Monitored Anesthesia Care Procedure:                Pre-Anesthesia Assessment:                           - Prior to the procedure, a History and Physical                            was performed, and patient medications and                            allergies were reviewed. The patient's tolerance of                            previous anesthesia was also reviewed. The risks                            and benefits of the procedure and the sedation                            options and risks were discussed with the patient.                            All questions were answered, and informed consent                            was obtained. Prior Anticoagulants: The patient has                            taken no previous anticoagulant or antiplatelet                            agents. ASA Grade Assessment: II - A patient with                            mild systemic disease. After reviewing the risks                            and benefits, the patient was deemed in  satisfactory condition to undergo the procedure.                           After obtaining informed consent, the colonoscope                            was passed under direct vision. Throughout the                            procedure, the patient's blood pressure, pulse, and                            oxygen saturations were monitored continuously. The    Colonoscope was introduced through the anus and                            advanced to the the cecum, identified by                            appendiceal orifice and ileocecal valve. The                            ileocecal valve, appendiceal orifice, and rectum                            were photographed. The quality of the bowel                            preparation was good. The colonoscopy was performed                            without difficulty. The patient tolerated the                            procedure well. The bowel preparation used was                            SUPREP. Scope In: 9:42:50 AM Scope Out: 9:57:32 AM Scope Withdrawal Time: 0 hours 10 minutes 44 seconds  Total Procedure Duration: 0 hours 14 minutes 42 seconds  Findings:                 Two polyps were found in the ascending colon. The                            polyps were 2 to 3 mm in size. These polyps were                            removed with a cold snare. Resection and retrieval                            were complete.                           Multiple diverticula were found in the  left colon.                           Internal hemorrhoids were found during retroflexion.                           The exam was otherwise without abnormality on                            direct and retroflexion views. Complications:            No immediate complications. Estimated blood loss:                            None. Estimated Blood Loss:     Estimated blood loss: none. Impression:               - Two 2 to 3 mm polyps in the ascending colon,                            removed with a cold snare. Resected and retrieved.                           - Diverticulosis in the left colon.                           - Internal hemorrhoids.                           - The examination was otherwise normal on direct                            and retroflexion views. Recommendation:           - Repeat colonoscopy in 5 years  for surveillance.                           - Patient has a contact number available for                            emergencies. The signs and symptoms of potential                            delayed complications were discussed with the                            patient. Return to normal activities tomorrow.                            Written discharge instructions were provided to the                            patient.                           - Resume previous diet.                           -  Continue present medications.                           - Await pathology results. Docia Chuck. Henrene Pastor, MD 11/04/2018 10:03:00 AM This report has been signed electronically.

## 2018-11-05 ENCOUNTER — Telehealth: Payer: Self-pay | Admitting: *Deleted

## 2018-11-05 NOTE — Telephone Encounter (Signed)
  Follow up Call-  Call back number 11/04/2018  Post procedure Call Back phone  # [phone]  Permission to leave phone message Yes  Some recent data might be hidden     Patient questions:  Do you have a fever, pain , or abdominal swelling? No. Pain Score  0 *  Have you tolerated food without any problems? Yes.    Have you been able to return to your normal activities? Yes.    Do you have any questions about your discharge instructions: Diet   No. Medications  No. Follow up visit  No.  Do you have questions or concerns about your Care? No.  Actions: * If pain score is 4 or above: No action needed, pain <4.

## 2018-11-05 NOTE — Telephone Encounter (Signed)
First follow up call attempt.  No answer or voicemail option. °

## 2018-11-06 ENCOUNTER — Encounter: Payer: Self-pay | Admitting: Internal Medicine

## 2018-11-08 DIAGNOSIS — Z86010 Personal history of colonic polyps: Secondary | ICD-10-CM

## 2018-11-08 DIAGNOSIS — Z860101 Personal history of adenomatous and serrated colon polyps: Secondary | ICD-10-CM

## 2018-11-08 HISTORY — DX: Personal history of adenomatous and serrated colon polyps: Z86.0101

## 2018-11-08 HISTORY — DX: Personal history of colonic polyps: Z86.010

## 2019-01-10 DIAGNOSIS — H524 Presbyopia: Secondary | ICD-10-CM | POA: Diagnosis not present

## 2019-01-10 DIAGNOSIS — H25013 Cortical age-related cataract, bilateral: Secondary | ICD-10-CM | POA: Diagnosis not present

## 2019-01-10 DIAGNOSIS — H2513 Age-related nuclear cataract, bilateral: Secondary | ICD-10-CM | POA: Diagnosis not present

## 2019-01-10 DIAGNOSIS — H43813 Vitreous degeneration, bilateral: Secondary | ICD-10-CM | POA: Diagnosis not present

## 2019-04-08 DIAGNOSIS — R69 Illness, unspecified: Secondary | ICD-10-CM | POA: Diagnosis not present

## 2019-05-22 DIAGNOSIS — Z23 Encounter for immunization: Secondary | ICD-10-CM | POA: Diagnosis not present

## 2019-09-10 DIAGNOSIS — Z125 Encounter for screening for malignant neoplasm of prostate: Secondary | ICD-10-CM | POA: Diagnosis not present

## 2019-09-10 DIAGNOSIS — E7800 Pure hypercholesterolemia, unspecified: Secondary | ICD-10-CM | POA: Diagnosis not present

## 2019-09-15 DIAGNOSIS — R82998 Other abnormal findings in urine: Secondary | ICD-10-CM | POA: Diagnosis not present

## 2019-09-16 DIAGNOSIS — Z1212 Encounter for screening for malignant neoplasm of rectum: Secondary | ICD-10-CM | POA: Diagnosis not present

## 2019-09-17 DIAGNOSIS — J309 Allergic rhinitis, unspecified: Secondary | ICD-10-CM | POA: Diagnosis not present

## 2019-09-17 DIAGNOSIS — E669 Obesity, unspecified: Secondary | ICD-10-CM | POA: Diagnosis not present

## 2019-09-17 DIAGNOSIS — D126 Benign neoplasm of colon, unspecified: Secondary | ICD-10-CM | POA: Diagnosis not present

## 2019-09-17 DIAGNOSIS — E7800 Pure hypercholesterolemia, unspecified: Secondary | ICD-10-CM | POA: Diagnosis not present

## 2019-09-17 DIAGNOSIS — M160 Bilateral primary osteoarthritis of hip: Secondary | ICD-10-CM | POA: Diagnosis not present

## 2019-09-17 DIAGNOSIS — Z1339 Encounter for screening examination for other mental health and behavioral disorders: Secondary | ICD-10-CM | POA: Diagnosis not present

## 2019-09-17 DIAGNOSIS — Z0000 Encounter for general adult medical examination without abnormal findings: Secondary | ICD-10-CM | POA: Diagnosis not present

## 2019-09-17 DIAGNOSIS — R69 Illness, unspecified: Secondary | ICD-10-CM | POA: Diagnosis not present

## 2019-09-17 DIAGNOSIS — Z1331 Encounter for screening for depression: Secondary | ICD-10-CM | POA: Diagnosis not present

## 2019-10-15 DIAGNOSIS — R69 Illness, unspecified: Secondary | ICD-10-CM | POA: Diagnosis not present

## 2019-10-23 ENCOUNTER — Ambulatory Visit: Payer: Self-pay

## 2019-10-26 ENCOUNTER — Ambulatory Visit: Payer: Medicare HMO | Attending: Internal Medicine

## 2019-10-26 ENCOUNTER — Other Ambulatory Visit: Payer: Self-pay

## 2019-10-26 DIAGNOSIS — Z23 Encounter for immunization: Secondary | ICD-10-CM | POA: Insufficient documentation

## 2019-10-26 NOTE — Progress Notes (Signed)
   Covid-19 Vaccination Clinic  Name:  Carlos [name]    MRN: [id number] DOB: [birth year]  10/26/2019  [name] was observed post Covid-19 immunization for 15 minutes without incident. He was provided with Vaccine Information Sheet and instruction to access the V-Safe system.   [name] was instructed to call 911 with any severe reactions post vaccine: Marland Kitchen Difficulty breathing  . Swelling of face and throat  . A fast heartbeat  . A bad rash all over body  . Dizziness and weakness   Immunizations Administered    Name Date Dose VIS Date Route   Pfizer COVID-19 Vaccine 10/26/2019 12:27 PM 0.3 mL 08/01/2019 Intramuscular   Manufacturer: [place]   Lot: [id number]   [place]: [id number]

## 2019-12-02 ENCOUNTER — Ambulatory Visit: Payer: Medicare HMO | Attending: Internal Medicine

## 2019-12-02 DIAGNOSIS — Z23 Encounter for immunization: Secondary | ICD-10-CM

## 2019-12-02 NOTE — Progress Notes (Signed)
   Covid-19 Vaccination Clinic  Name:  Carlos [name]    MRN: [id number] DOB: [birth year]  12/02/2019  Carlos [name] was observed post Covid-19 immunization for 15 minutes without incident. He was provided with Vaccine Information Sheet and instruction to access the V-Safe system.   Carlos [name] was instructed to call 911 with any severe reactions post vaccine: Marland Kitchen Difficulty breathing  . Swelling of face and throat  . A fast heartbeat  . A bad rash all over body  . Dizziness and weakness   Immunizations Administered    Name Date Dose VIS Date Route   Pfizer COVID-19 Vaccine 12/02/2019  8:26 AM 0.3 mL 08/01/2019 Intramuscular   Manufacturer: [place]   Lot: [id number]   [place]: [id number]

## 2020-01-13 DIAGNOSIS — H25013 Cortical age-related cataract, bilateral: Secondary | ICD-10-CM | POA: Diagnosis not present

## 2020-01-13 DIAGNOSIS — H02831 Dermatochalasis of right upper eyelid: Secondary | ICD-10-CM | POA: Diagnosis not present

## 2020-01-13 DIAGNOSIS — H524 Presbyopia: Secondary | ICD-10-CM | POA: Diagnosis not present

## 2020-01-13 DIAGNOSIS — H02834 Dermatochalasis of left upper eyelid: Secondary | ICD-10-CM | POA: Diagnosis not present

## 2020-04-15 DIAGNOSIS — R69 Illness, unspecified: Secondary | ICD-10-CM | POA: Diagnosis not present

## 2020-06-12 DIAGNOSIS — Z23 Encounter for immunization: Secondary | ICD-10-CM | POA: Diagnosis not present

## 2020-07-20 ENCOUNTER — Ambulatory Visit: Payer: Medicare HMO | Admitting: Dermatology

## 2020-07-20 ENCOUNTER — Other Ambulatory Visit: Payer: Self-pay

## 2020-07-20 DIAGNOSIS — L219 Seborrheic dermatitis, unspecified: Secondary | ICD-10-CM | POA: Diagnosis not present

## 2020-07-20 DIAGNOSIS — L820 Inflamed seborrheic keratosis: Secondary | ICD-10-CM

## 2020-07-20 DIAGNOSIS — B079 Viral wart, unspecified: Secondary | ICD-10-CM | POA: Diagnosis not present

## 2020-07-20 DIAGNOSIS — D225 Melanocytic nevi of trunk: Secondary | ICD-10-CM | POA: Diagnosis not present

## 2020-07-20 DIAGNOSIS — D485 Neoplasm of uncertain behavior of skin: Secondary | ICD-10-CM | POA: Diagnosis not present

## 2020-07-20 DIAGNOSIS — Z1283 Encounter for screening for malignant neoplasm of skin: Secondary | ICD-10-CM

## 2020-07-20 MED ORDER — FLUOCINOLONE ACETONIDE 0.01 % OT OIL: 1.0000 "application " | TOPICAL_OIL | Freq: Every morning | OTIC | 6 refills | Status: DC

## 2020-07-20 MED ORDER — FLUOCINOLONE ACETONIDE 0.01 % OT OIL: 1.0000 "application " | TOPICAL_OIL | Freq: Every morning | OTIC | 6 refills | Status: AC

## 2020-07-21 ENCOUNTER — Encounter: Payer: Self-pay | Admitting: Dermatology

## 2020-07-22 NOTE — Progress Notes (Signed)
   Follow-Up Visit   Subjective  Carlos [name] is a 74 y.o. male who presents for the following: Annual Exam (few scaly spots on face & ears, Left upper arm- itch, left forearm- itch).  General skin check Location:  Duration:  Quality:  Associated Signs/Symptoms: Modifying Factors:  Severity:  Timing: Context:   Objective  Well appearing patient in no apparent distress; mood and affect are within normal limits.  All skin waist up examined.   Assessment & Plan    Encounter for screening for malignant neoplasm of skin Right Breast  Yearly skin check  Neoplasm of uncertain behavior of skin (3) Right Eyebrow  Skin / nail biopsy Type of biopsy: tangential   Informed consent: discussed and consent obtained   Timeout: patient name, date of birth, surgical site, and procedure verified   Procedure prep:  Patient was prepped and draped in usual sterile fashion (Non sterile) Prep type:  Chlorhexidine Anesthesia: the lesion was anesthetized in a standard fashion   Anesthetic:  1% lidocaine w/ epinephrine 1-100,000 local infiltration Instrument used: flexible razor blade   Outcome: patient tolerated procedure well   Post-procedure details: wound care instructions given    Specimen 1 - Surgical pathology Differential Diagnosis: bcc vs scc Check Margins: No  Left Forearm - Posterior  Skin / nail biopsy Type of biopsy: tangential   Informed consent: discussed and consent obtained   Timeout: patient name, date of birth, surgical site, and procedure verified   Procedure prep:  Patient was prepped and draped in usual sterile fashion (Non sterile) Prep type:  Chlorhexidine Anesthesia: the lesion was anesthetized in a standard fashion   Anesthetic:  1% lidocaine w/ epinephrine 1-100,000 local infiltration Instrument used: flexible razor blade   Outcome: patient tolerated procedure well   Post-procedure details: wound care instructions given    Specimen 2 - Surgical  pathology Differential Diagnosis: r/o atypia Check Margins: No  Right Lower Back  Skin / nail biopsy Type of biopsy: tangential   Informed consent: discussed and consent obtained   Timeout: patient name, date of birth, surgical site, and procedure verified   Procedure prep:  Patient was prepped and draped in usual sterile fashion (Non sterile) Prep type:  Chlorhexidine Anesthesia: the lesion was anesthetized in a standard fashion   Anesthetic:  1% lidocaine w/ epinephrine 1-100,000 local infiltration Instrument used: flexible razor blade   Outcome: patient tolerated procedure well   Post-procedure details: wound care instructions given    Specimen 3 - Surgical pathology Differential Diagnosis: r/o atypia Check Margins: No  Seborrheic dermatitis Left [place]  Okay refills fluocinolone  Seborrheic keratosis, inflamed Left Upper Arm - Posterior  Destruction of lesion - Left Upper Arm - Posterior Complexity: simple   Destruction method: cryotherapy   Informed consent: discussed and consent obtained   Timeout:  patient name, date of birth, surgical site, and procedure verified Lesion destroyed using liquid nitrogen: Yes   Cryotherapy cycles:  3 Outcome: patient tolerated procedure well with no complications       I, Lavonna Monarch, MD, have reviewed all documentation for this visit.  The documentation on 07/22/20 for the exam, diagnosis, procedures, and orders are all accurate and complete.

## 2020-07-23 ENCOUNTER — Telehealth: Payer: Self-pay | Admitting: *Deleted

## 2020-07-23 NOTE — Telephone Encounter (Signed)
Left message for patient to call back for pathology results.

## 2020-07-23 NOTE — Telephone Encounter (Signed)
Path to patient. Made a follow up in may to recheck patients back.

## 2020-07-23 NOTE — Telephone Encounter (Signed)
-----   Message from Lavonna Monarch, MD sent at 07/23/2020  6:51 AM EST ----- I would like to recheck the dysplastic mole on lower back in 4 to 6 months

## 2020-09-14 DIAGNOSIS — E7800 Pure hypercholesterolemia, unspecified: Secondary | ICD-10-CM | POA: Diagnosis not present

## 2020-09-14 DIAGNOSIS — Z125 Encounter for screening for malignant neoplasm of prostate: Secondary | ICD-10-CM | POA: Diagnosis not present

## 2020-09-15 ENCOUNTER — Ambulatory Visit: Payer: Medicare HMO | Admitting: Dermatology

## 2020-09-17 DIAGNOSIS — Z7689 Persons encountering health services in other specified circumstances: Secondary | ICD-10-CM | POA: Diagnosis not present

## 2020-09-21 ENCOUNTER — Other Ambulatory Visit: Payer: Self-pay | Admitting: Internal Medicine

## 2020-09-21 DIAGNOSIS — E669 Obesity, unspecified: Secondary | ICD-10-CM | POA: Diagnosis not present

## 2020-09-21 DIAGNOSIS — D696 Thrombocytopenia, unspecified: Secondary | ICD-10-CM | POA: Diagnosis not present

## 2020-09-21 DIAGNOSIS — R69 Illness, unspecified: Secondary | ICD-10-CM | POA: Diagnosis not present

## 2020-09-21 DIAGNOSIS — M67431 Ganglion, right wrist: Secondary | ICD-10-CM | POA: Diagnosis not present

## 2020-09-21 DIAGNOSIS — R82998 Other abnormal findings in urine: Secondary | ICD-10-CM | POA: Diagnosis not present

## 2020-09-21 DIAGNOSIS — Z1212 Encounter for screening for malignant neoplasm of rectum: Secondary | ICD-10-CM | POA: Diagnosis not present

## 2020-09-21 DIAGNOSIS — M160 Bilateral primary osteoarthritis of hip: Secondary | ICD-10-CM | POA: Diagnosis not present

## 2020-09-21 DIAGNOSIS — Z0000 Encounter for general adult medical examination without abnormal findings: Secondary | ICD-10-CM | POA: Diagnosis not present

## 2020-09-21 DIAGNOSIS — E7800 Pure hypercholesterolemia, unspecified: Secondary | ICD-10-CM | POA: Diagnosis not present

## 2020-09-21 DIAGNOSIS — D126 Benign neoplasm of colon, unspecified: Secondary | ICD-10-CM | POA: Diagnosis not present

## 2020-09-21 DIAGNOSIS — D72819 Decreased white blood cell count, unspecified: Secondary | ICD-10-CM | POA: Diagnosis not present

## 2020-09-21 DIAGNOSIS — Z6831 Body mass index (BMI) 31.0-31.9, adult: Secondary | ICD-10-CM | POA: Diagnosis not present

## 2020-12-19 DIAGNOSIS — I2510 Atherosclerotic heart disease of native coronary artery without angina pectoris: Secondary | ICD-10-CM

## 2020-12-19 HISTORY — DX: Atherosclerotic heart disease of native coronary artery without angina pectoris: I25.10

## 2021-01-03 ENCOUNTER — Other Ambulatory Visit: Payer: Self-pay

## 2021-01-03 ENCOUNTER — Ambulatory Visit: Payer: Medicare HMO | Admitting: Dermatology

## 2021-01-03 ENCOUNTER — Encounter: Payer: Self-pay | Admitting: Dermatology

## 2021-01-03 DIAGNOSIS — L570 Actinic keratosis: Secondary | ICD-10-CM | POA: Diagnosis not present

## 2021-01-03 DIAGNOSIS — L299 Pruritus, unspecified: Secondary | ICD-10-CM

## 2021-01-03 MED ORDER — BETAMETHASONE DIPROPIONATE 0.05 % EX CREA: TOPICAL_CREAM | Freq: Two times a day (BID) | CUTANEOUS | 3 refills | Status: DC | PRN

## 2021-01-03 NOTE — Patient Instructions (Signed)
Sample Cereve anti itch cream. Pramoxine is main ingredient.

## 2021-01-13 ENCOUNTER — Other Ambulatory Visit: Payer: Self-pay

## 2021-01-13 ENCOUNTER — Ambulatory Visit
Admission: RE | Admit: 2021-01-13 | Discharge: 2021-01-13 | Disposition: A | Discharge status: Home / Self Care | Payer: No Typology Code available for payment source | Source: Ambulatory Visit | Attending: Internal Medicine | Admitting: Internal Medicine

## 2021-01-13 DIAGNOSIS — E785 Hyperlipidemia, unspecified: Secondary | ICD-10-CM | POA: Diagnosis not present

## 2021-01-13 DIAGNOSIS — E7800 Pure hypercholesterolemia, unspecified: Secondary | ICD-10-CM

## 2021-01-16 ENCOUNTER — Encounter: Payer: Self-pay | Admitting: Dermatology

## 2021-01-16 NOTE — Progress Notes (Addendum)
   Follow-Up Visit   Subjective  Carlos [name] is a 75 y.o. male who presents for the following: Follow-up (Right eyebrow- removed wart and know has a thick spot & back - itchy spots).  Crust on face and itching area on back. Location:  Duration:  Quality:  Associated Signs/Symptoms: Modifying Factors:  Severity:  Timing: Context:   Objective  Well appearing patient in no apparent distress; mood and affect are within normal limits. Objective  Right Eyebrow, Right Zygomatic Area: 3 to 4 mm hornlike pink crusts  Objective  Mid Back: Although there is a subtle visible dermatitis, this may be secondary to scratching and this may represent a neurogenic itch called notalgia paresthetica.    All skin waist up examined.   Assessment & Plan    AK (actinic keratosis) (2) Right Eyebrow; Right Zygomatic Area  Destruction of lesion - Right Eyebrow, Right Zygomatic Area Complexity: simple   Destruction method: cryotherapy   Informed consent: discussed and consent obtained   Timeout:  patient name, date of birth, surgical site, and procedure verified Lesion destroyed using liquid nitrogen: Yes   Cryotherapy cycles:  3 Outcome: patient tolerated procedure well with no complications   Post-procedure details: wound care instructions given    Pruritus Mid Back  Will try the OTC Cereve anti itch cream first.  This can be applied daily after bathing and as frequently as needed if it provides some itch relief.  Should this fail, he has a prescription available for betamethasone to be used once daily on back after bathing for 1 month.  Follow-up by telephone in 1 to 2 months.  Ordered Medications: betamethasone dipropionate 0.05 % cream      I, Lavonna Monarch, MD, have reviewed all documentation for this visit.  The documentation on 01/16/21 for the exam, diagnosis, procedures, and orders are all accurate and complete.

## 2021-01-18 DIAGNOSIS — H43813 Vitreous degeneration, bilateral: Secondary | ICD-10-CM | POA: Diagnosis not present

## 2021-01-18 DIAGNOSIS — H524 Presbyopia: Secondary | ICD-10-CM | POA: Diagnosis not present

## 2021-01-18 DIAGNOSIS — H2513 Age-related nuclear cataract, bilateral: Secondary | ICD-10-CM | POA: Diagnosis not present

## 2021-01-18 DIAGNOSIS — H1789 Other corneal scars and opacities: Secondary | ICD-10-CM | POA: Diagnosis not present

## 2021-02-07 ENCOUNTER — Encounter: Payer: Self-pay | Admitting: Cardiology

## 2021-02-07 ENCOUNTER — Other Ambulatory Visit: Payer: Self-pay

## 2021-02-07 ENCOUNTER — Ambulatory Visit: Payer: Medicare HMO | Admitting: Cardiology

## 2021-02-07 VITALS — BP 140/83 | HR 85 | Ht 73.0 in | Wt 225.2 lb

## 2021-02-07 DIAGNOSIS — R030 Elevated blood-pressure reading, without diagnosis of hypertension: Secondary | ICD-10-CM

## 2021-02-07 DIAGNOSIS — I350 Nonrheumatic aortic (valve) stenosis: Secondary | ICD-10-CM | POA: Insufficient documentation

## 2021-02-07 DIAGNOSIS — E785 Hyperlipidemia, unspecified: Secondary | ICD-10-CM | POA: Diagnosis not present

## 2021-02-07 DIAGNOSIS — R931 Abnormal findings on diagnostic imaging of heart and coronary circulation: Secondary | ICD-10-CM | POA: Diagnosis not present

## 2021-02-07 DIAGNOSIS — I351 Nonrheumatic aortic (valve) insufficiency: Secondary | ICD-10-CM

## 2021-02-07 MED ORDER — CO Q 10 100 MG PO CAPS: 300.0000 mg | ORAL_CAPSULE | Freq: Every day | ORAL | 3 refills | Status: DC

## 2021-02-07 MED ORDER — ASPIRIN EC 81 MG PO TBEC: 81.0000 mg | DELAYED_RELEASE_TABLET | Freq: Every day | ORAL | 3 refills | Status: AC

## 2021-02-07 NOTE — Patient Instructions (Addendum)
Medication Instructions:    Continue  Crestor ( Rosuvastatin)   Start taking Aspirin 81 mg one tablet daily   Start taking  CoQ10 300 mg - start off with 100 mg  for 2 weeks then increase to 200 mg  for 2 weeks then increase to 300 mg daily   *If you need a refill on your cardiac medications before your next appointment, please call your pharmacy*   Lab Work: Not needed If you have labs (blood work) drawn today and your tests are completely normal, you will receive your results only by: [place] (if you have MyChart) OR A paper copy in the mail If you have any lab test that is abnormal or we need to change your treatment, we will call you to review the results.   Testing/Procedures Will be schedule at [facility] has requested that you have an echocardiogram. Echocardiography is a painless test that uses sound waves to create images of your heart. It provides your doctor with information about the size and shape of your heart and how well your heart's chambers and valves are working. This procedure takes approximately one hour. There are no restrictions for this procedure.   Follow-Up: At [facility], you and your health needs are our priority.  As part of our continuing mission to provide you with exceptional heart care, we have created designated Provider Care Teams.  These Care Teams include your primary Cardiologist (physician) and Advanced Practice Providers (APPs -  Physician Assistants and Nurse Practitioners) who all work together to provide you with the care you need, when you need it.  We recommend signing up for the patient portal called "MyChart".  Sign up information is provided on this After Visit Summary.  MyChart is used to connect with patients for Virtual Visits (Telemedicine).  Patients are able to view lab/test results, encounter notes, upcoming appointments, etc.  Non-urgent messages can be sent to your provider as well.    To learn more about what you can do with MyChart, go to [email or web address].    Your next appointment:   6 month(s)  The format for your next appointment:   In Person  Provider:   Glenetta Hew, MD

## 2021-02-07 NOTE — Progress Notes (Signed)
Primary Care Provider: Haywood Pao, MD Cardiologist: Glenetta Hew, MD Electrophysiologist: None  Clinic Note: Chief Complaint  Patient presents with   New Patient (Initial Visit)    Elevated Coronary Artery Calcium Score -> total Agatston score 718     ===================================  ASSESSMENT/PLAN   Problem List Items Addressed This Visit     Aortic ejection murmur    Aortic valve murmur with evidence of aortic valve calcification on Cardiac CT.  Likely only aortic sclerosis, however given his advanced age and the amount of calcium seen on CT scan, will check 2D echocardiogram to exclude aortic stenosis.  I explained to [name] the basic difference between sclerosis and stenosis.       Relevant Orders   EKG 12-Lead (Completed)   ECHOCARDIOGRAM COMPLETE   Elevated blood pressure reading without diagnosis of hypertension    Per his report, he has had issues with elevated blood pressures at doctor's offices before.  He says at home his pressures are usually much better than this, and in the past has had pressures checked once he has been sitting in a quiet room at the doctor's office, and his BP is usually well controlled.  He has had blood pressure checked while in the middle of an argument at work, and his BP is better than it is at a doctor's office.   Low threshold to consider adding ARB.       Agatston coronary artery calcium score greater than 400 - Primary (Chronic)    Elevated calcium score greater than 700 is somewhat concerning for underlying obstructive CAD, however he is very active, and in the absence of ongoing symptoms, there is no indication for ischemic evaluation at this point.  With a low threshold to consider ischemic evaluation with either coronary CTA versus Myoview stress test if they were to be any concerning symptoms of exertional dyspnea, fatigue or chest discomfort.  As of yet, he seems to be asymptomatic.  Plan: Aggressive Risk  Factor Modification with Guideline Directed Medical Therapy Agree with starting aspirin 81 mg daily.  Okay to hold for procedures 5 days preop. He was just started on rosuvastatin 20 mg daily.  Should be due for recheck of lipids and LFTs in roughly 3 months.  If not checked by PCP, we can check. Monitor blood pressure, currently pressures seem to be better at home than they are here.  I have asked that he keep a track of his pressures so we can adjust accordingly.  Low threshold to consider addition of BP medication, although would probably try ARB first as opposed to beta-blocker because concerns for fatigue issues. Okay to continue [employer].  Probably needs to increase the doses that he is taking. Continue staying active with the exercise and working on weight loss. -> I explained to [name] that if he is not able to do the level of activity and exercise that he is able to do now, we should seriously consider ischemic evaluation.       Relevant Medications   aspirin EC 81 MG tablet   rosuvastatin (CRESTOR) 20 MG tablet   Other Relevant Orders   EKG 12-Lead (Completed)   ECHOCARDIOGRAM COMPLETE   Hyperlipidemia with target LDL less than 70 (Chronic)    Based on the level of coronary calcium score elevation at 700+ with three-vessel disease, we need to treat this is if there is existing CAD and would therefore target LDL less than 70.  Was just started on  20 mg of rosuvastatin.  Need to see how he tolerates it.  He was not very excited about taking medications but after long discussion he was agreeable.  We talked about his concerns of statins potentially causing diabetes.  I indicated that more likely side effects or myalgias, arthralgias and memory issues.  While there is evidence of a increase in blood sugars statins, he would not likely develop diabetes simply because of that.  Recommended using co-Q10 supplement along with statin to avoid side effects.  Should be due for lab recheck in  roughly 3 months.       Relevant Medications   aspirin EC 81 MG tablet   rosuvastatin (CRESTOR) 20 MG tablet   ===================================  HPI:    Carlos [name] - "Carlos [name]" is a 75 y.o. male who is being seen today for the evaluation of high Coronary Calcium Score at the request of Carlos [name], Carlos Him, MD.  Carlos [name] was seen on September 21, 2020 by his PCP (Carlos [name]) for annual physical.  Noted that with borderline lipid levels and high ApoB, the screening test with Coronary Calcium Score would be a good idea.  Patient self was not noting any issues at all with chest tightness or pressure with rest or exertion.  No dyspnea with rest or exertion. => Coronary Calcium Score checked (see below) -> written prescription for Crestor 20 mg daily and told to start aspirin 81 mg daily.  Recent Hospitalizations: None  Reviewed  CV studies:    The following studies were reviewed today: (if available, images/films reviewed: From Epic Chart or Care Everywhere) CT Cardiac Calcium Scoring: Total Eggleston score 718.  LM-0, LAD-408, LCx 183, RCA 127.  (72nd percentile).  Calcification also noted in the aortic valve, aortic annulus and mitral annulus (recommend echocardiogram).  Subpleural interstitial thickening with potential development of areas of fibrosis.  Consistent with chronic interstitial lung disease.  Small to moderate hiatal hernia.   Interval History:   Carlos [name] presents here today for cardiology evaluation with multiple questions about his coronary calcium score.  From a cardiac standpoint, he is pretty stable.  He is relatively active although he admits that since the onset of COVID, he has not been nearly as active with exercise as he had been.  Pre-COVID, and he would exercise at the gym at least 5 to 6 days a week doing roughly 30 minutes on the recumbent bike, machine exercises with arm presses and chest presses/leg presses. He has been relatively  scared and would like to go back to the gym to do exercise since COVID.  Always been doing his walking around the neighborhood and doing some calisthenic exercises.  He is really hoping to get back to the gym.  They actually have been going walking in stores such as [employer].  He walks just about every day, but not to the extent that he had been doing.  With this level of activity, he denies any issues at all with chest tightness or pressure with rest or exertion.  He has not had any PND, orthopnea or edema.  No exertional dyspnea.  He denies any rapid or irregular heart beats.    He says that he has chronic whitecoat syndrome indicating that whenever he is at home his pressures are always running in the 120s to 130s/60s and 70s.  No matter where it is he goes, when he has to sit in the waiting room, his blood pressure goes high.  In the  past his previous PCP used to have [name] sit in a quiet room for 5 minutes and have vital signs taken there and noted significant change in blood pressure.  He indicates that he really does not have any significant family history of cardiac disease-Father died at age 78 from pneumonia and was only diagnosed with hypertension at age 12.  CV Review of Symptoms (Summary) Cardiovascular ROS: positive for - A little deconditioning due to lack of activity since COVID-19 -> Maybe a little less exercise tolerance than before, a little bit of exertional dyspnea as a result. negative for - chest pain, dyspnea on exertion, edema, irregular heartbeat, orthopnea, palpitations, paroxysmal nocturnal dyspnea, rapid heart rate, shortness of breath, or Syncope/near syncope or TIA/amaurosis fugax, claudication  REVIEWED OF SYSTEMS   Review of Systems  Constitutional:  Negative for malaise/fatigue (A little bit of deconditioning-not as in shape as he used to be.) and weight loss.  HENT:  Negative for congestion and nosebleeds.   Respiratory:  Negative for cough and shortness of breath.    Cardiovascular:        Per HPI  Gastrointestinal:  Negative for blood in stool and melena.  Genitourinary:  Positive for frequency (Nocturia). Negative for dysuria and hematuria.  Musculoskeletal:  Positive for back pain and joint pain (Hips/knees).  Neurological:  Negative for dizziness, focal weakness, weakness and headaches.  Psychiatric/Behavioral:  Negative for depression and memory loss. The patient is not nervous/anxious (Just whitecoat syndrome) and does not have insomnia.    I have reviewed and (if needed) personally updated the patient's problem list, medications, allergies, past medical and surgical history, social and family history.   PAST MEDICAL HISTORY   Past Medical History:  Diagnosis Date   Arthritis    Coronary artery disease    Coronary calcium score of 718   Erectile dysfunction    Hx of adenomatous colonic polyps 11/08/2018   Dr. Henrene Pastor   Hyperlipidemia    Hypertension    Osteoarthritis of bilateral glenohumeral joints    Also both knees   Seasonal allergies     PAST SURGICAL HISTORY   Past Surgical History:  Procedure Laterality Date   COLONOSCOPY  05/13/2013   HERNIA REPAIR Right 1997   POLYPECTOMY      Immunization History  Administered Date(s) Administered   PFIZER(Purple Top)SARS-COV-2 Vaccination 10/26/2019, 12/02/2019    MEDICATIONS/ALLERGIES   Current Meds  Medication Sig   Ascorbic Acid (VITAMIN C) 1000 MG tablet Take 1,000 mg by mouth daily.   aspirin EC 81 MG tablet Take 1 tablet (81 mg total) by mouth daily.   betamethasone dipropionate 0.05 % cream Apply topically 2 (two) times daily as needed (Rash).   Cholecalciferol (VITAMIN D-3 PO) Take by mouth.   Coenzyme Q10 (CO Q 10) 100 MG CAPS Take 300 mg by mouth daily at 12 noon.   Fluocinolone Acetonide 0.01 % OIL Place 1 application in ear(s) every morning.   KRILL OIL PO Take by mouth.   multivitamin-lutein (OCUVITE-LUTEIN) CAPS capsule Take 1 capsule by mouth daily.    rosuvastatin (CRESTOR) 20 MG tablet Take 20 mg by mouth daily.   TURMERIC PO Take by mouth.  He has a prescription for, but has not yet started rosuvastatin 20 mg daily.  No Known Allergies  SOCIAL HISTORY/FAMILY HISTORY   Reviewed in Epic:  Pertinent findings:  Social History   Tobacco Use   Smoking status: Never   Smokeless tobacco: Never  Vaping Use   Vaping Use: Never  used  Substance Use Topics   Alcohol use: Not Currently    Comment: rarely   Drug use: No   Social History   Social History Narrative   Married father of 2.   [employer] of [employer]    OBJCTIVE -PE, EKG, labs   Wt Readings from Last 3 Encounters:  02/07/21 225 lb 3.2 oz (102.2 kg)  11/04/18 250 lb (113.4 kg)  10/21/18 250 lb (113.4 kg)    Physical Exam: BP 140/83   Pulse 85   Ht 6\' 1"  (1.854 m)   Wt 225 lb 3.2 oz (102.2 kg)   SpO2 96%   BMI 29.71 kg/m  Physical Exam Vitals reviewed.  Constitutional:      General: He is not in acute distress.    Appearance: Normal appearance. He is obese. He is not ill-appearing or toxic-appearing.     Comments: Borderline obese.  BMI just under 30.  Well-groomed.  Healthy-appearing.  HENT:     Head: Normocephalic and atraumatic.  Neck:     Vascular: No carotid bruit (Soft radiated aortic murmur), hepatojugular reflux or JVD.  Cardiovascular:     Rate and Rhythm: Normal rate and regular rhythm. No extrasystoles are present.    Chest Wall: PMI is not displaced.     Pulses: Normal pulses and intact distal pulses.     Heart sounds: S1 normal and S2 normal. Heart sounds are distant. Murmur (Harsh 1-2C-D SEM at RUSB.-Neck.) heard.    No friction rub. No gallop.  Pulmonary:     Effort: Pulmonary effort is normal. No respiratory distress.     Breath sounds: Normal breath sounds. No wheezing, rhonchi or rales.  Chest:     Chest wall: No tenderness.  Abdominal:     General: Abdomen is flat. Bowel sounds are normal. There is no  distension.     Palpations: Abdomen is soft. There is no mass.     Tenderness: There is no abdominal tenderness.     Comments: No HSM or bruit  Musculoskeletal:        General: No swelling. Normal range of motion.     Cervical back: Normal range of motion and neck supple.  Skin:    General: Skin is warm and dry.  Neurological:     General: No focal deficit present.     Mental Status: He is alert and oriented to person, place, and time.     Cranial Nerves: No cranial nerve deficit.     Motor: No weakness.     Gait: Gait normal.  Psychiatric:        Mood and Affect: Mood normal.        Behavior: Behavior normal.        Thought Content: Thought content normal.        Judgment: Judgment normal.     Adult ECG Report  Rate: 85 ;  Rhythm: normal sinus rhythm, premature ventricular contractions (PVC), and Normal axis, intervals and durations. ; Artifact  Narrative Interpretation: Relatively normal  Recent Labs:   09/14/2020 Na+ 141, K+ 4.6, Cl- 102, HCO3- 20 , BUN 15, Cr 1.04, Glu 111 (h), Ca2+ 9.3; AST 23, ALT 27, AlkP 67; T Bili 0.4, TP 6.8 PSA 1.594 CBC: W 3.8, H/H 13.1/40.8, Plt 65* TC 218, TG 244, HDL 39, LDL 130; ApoB 137   No results found for: CHOL, HDL, LDLCALC, LDLDIRECT, TRIG, CHOLHDL No results found for: CREATININE, BUN, NA, K, CL, CO2 No flowsheet data found.  No results found for: TSH  ==================================================  COVID-19 Education: The signs and symptoms of COVID-19 were discussed with the patient and how to seek care for testing (follow up with PCP or arrange E-visit).    I spent a total of 41 minutes with the patient spent in direct patient consultation.  --Multiple questions asked & answered.  Reviewed pathophysiology of Atherosclerosis with Positive & Negative Remodeling. Additional time spent with chart review  / charting (studies, outside notes, etc): 35 min Total Time: 76 min  Current medicines are reviewed at length with the  patient today.  (+/- concerns) Lots of ?s about ASA, Crestor etc (answered)  This visit occurred during the SARS-CoV-2 public health emergency.  Safety protocols were in place, including screening questions prior to the visit, additional usage of staff PPE, and extensive cleaning of exam room while observing appropriate contact time as indicated for disinfecting solutions.  Notice: This dictation was prepared with Dragon dictation along with smaller phrase technology. Any transcriptional errors that result from this process are unintentional and may not be corrected upon review.  Patient Instructions / Medication Changes & Studies & Tests Ordered   Patient Instructions  Medication Instructions:    Continue  Crestor ( Rosuvastatin)   Start taking Aspirin 81 mg one tablet daily   Start taking  CoQ10 300 mg - start off with 100 mg  for 2 weeks then increase to 200 mg  for 2 weeks then increase to 300 mg daily   *If you need a refill on your cardiac medications before your next appointment, please call your pharmacy*   Lab Work: Not needed If you have labs (blood work) drawn today and your tests are completely normal, you will receive your results only by: MyChart Message (if you have MyChart) OR A paper copy in the mail If you have any lab test that is abnormal or we need to change your treatment, we will call you to review the results.   Testing/Procedures Will be schedule at [facility] has requested that you have an echocardiogram. Echocardiography is a painless test that uses sound waves to create images of your heart. It provides your doctor with information about the size and shape of your heart and how well your heart's chambers and valves are working. This procedure takes approximately one hour. There are no restrictions for this procedure.   Follow-Up: At [facility], you and your health needs are our priority.  As part of our  continuing mission to provide you with exceptional heart care, we have created designated Provider Care Teams.  These Care Teams include your primary Cardiologist (physician) and Advanced Practice Providers (APPs -  Physician Assistants and Nurse Practitioners) who all work together to provide you with the care you need, when you need it.  We recommend signing up for the patient portal called "MyChart".  Sign up information is provided on this After Visit Summary.  MyChart is used to connect with patients for Virtual Visits (Telemedicine).  Patients are able to view lab/test results, encounter notes, upcoming appointments, etc.  Non-urgent messages can be sent to your provider as well.   To learn more about what you can do with MyChart, go to [email or web address].    Your next appointment:   6 month(s)  The format for your next appointment:   In Person  Provider:   Glenetta Hew, MD Studies Ordered:   Orders Placed This Encounter  Procedures   EKG 12-Lead  ECHOCARDIOGRAM COMPLETE     Glenetta Hew, M.D., M.S. Interventional Cardiologist   Pager # [phone] Phone # [phone] [address] [place], [place] [address]   Thank you for choosing Heartcare at [facility]!!

## 2021-02-08 ENCOUNTER — Encounter: Payer: Self-pay | Admitting: Cardiology

## 2021-02-08 DIAGNOSIS — R030 Elevated blood-pressure reading, without diagnosis of hypertension: Secondary | ICD-10-CM | POA: Insufficient documentation

## 2021-02-08 NOTE — Assessment & Plan Note (Signed)
Elevated calcium score greater than 700 is somewhat concerning for underlying obstructive CAD, however he is very active, and in the absence of ongoing symptoms, there is no indication for ischemic evaluation at this point.  With a low threshold to consider ischemic evaluation with either coronary CTA versus Myoview stress test if they were to be any concerning symptoms of exertional dyspnea, fatigue or chest discomfort.  As of yet, he seems to be asymptomatic.  Plan: Aggressive Risk Factor Modification with Guideline Directed Medical Therapy  Agree with starting aspirin 81 mg daily.  Okay to hold for procedures 5 days preop.  He was just started on rosuvastatin 20 mg daily.  Should be due for recheck of lipids and LFTs in roughly 3 months.  If not checked by PCP, we can check.  Monitor blood pressure, currently pressures seem to be better at home than they are here.  I have asked that he keep a track of his pressures so we can adjust accordingly.  Low threshold to consider addition of BP medication, although would probably try ARB first as opposed to beta-blocker because concerns for fatigue issues.  Okay to continue [employer].  Probably needs to increase the doses that he is taking.  Continue staying active with the exercise and working on weight loss. -> I explained to him that if he is not able to do the level of activity and exercise that he is able to do now, we should seriously consider ischemic evaluation.

## 2021-02-08 NOTE — Assessment & Plan Note (Addendum)
Based on the level of coronary calcium score elevation at 700+ with three-vessel disease, we need to treat this is if there is existing CAD and would therefore target LDL less than 70.  Was just started on 20 mg of rosuvastatin.  Need to see how he tolerates it.  He was not very excited about taking medications but after long discussion he was agreeable.  We talked about his concerns of statins potentially causing diabetes.  I indicated that more likely side effects or myalgias, arthralgias and memory issues.  While there is evidence of a increase in blood sugars statins, he would not likely develop diabetes simply because of that.  Recommended using co-Q10 supplement along with statin to avoid side effects.  Should be due for lab recheck in roughly 3 months.

## 2021-02-08 NOTE — Assessment & Plan Note (Signed)
Per his report, he has had issues with elevated blood pressures at doctor's offices before.  He says at home his pressures are usually much better than this, and in the past has had pressures checked once he has been sitting in a quiet room at the doctor's office, and his BP is usually well controlled.  He has had blood pressure checked while in the middle of an argument at work, and his BP is better than it is at a doctor's office.   Low threshold to consider adding ARB.

## 2021-02-08 NOTE — Assessment & Plan Note (Signed)
Aortic valve murmur with evidence of aortic valve calcification on Cardiac CT.  Likely only aortic sclerosis, however given his advanced age and the amount of calcium seen on CT scan, will check 2D echocardiogram to exclude aortic stenosis.  I explained to him the basic difference between sclerosis and stenosis.

## 2021-02-18 HISTORY — PX: TRANSTHORACIC ECHOCARDIOGRAM: SHX275

## 2021-03-03 ENCOUNTER — Ambulatory Visit (HOSPITAL_COMMUNITY): Payer: Medicare HMO | Attending: Cardiology

## 2021-03-03 ENCOUNTER — Other Ambulatory Visit: Payer: Self-pay

## 2021-03-03 DIAGNOSIS — R931 Abnormal findings on diagnostic imaging of heart and coronary circulation: Secondary | ICD-10-CM | POA: Diagnosis not present

## 2021-03-03 DIAGNOSIS — I351 Nonrheumatic aortic (valve) insufficiency: Secondary | ICD-10-CM | POA: Diagnosis not present

## 2021-03-03 LAB — ECHOCARDIOGRAM COMPLETE
AR max vel: 1.57 cm2
AV Area VTI: 1.63 cm2
AV Area mean vel: 1.65 cm2
AV Mean grad: 12.4 mmHg
AV Peak grad: 20.6 mmHg
Ao pk vel: 2.27 m/s
Area-P 1/2: 2.95 cm2
S' Lateral: 3.4 cm

## 2021-05-21 DIAGNOSIS — Z23 Encounter for immunization: Secondary | ICD-10-CM | POA: Diagnosis not present

## 2021-06-28 IMAGING — CT CT CARDIAC CORONARY ARTERY CALCIUM SCORE
2 of 6 series · 4 of 20 positions shown, 5 images · non-contrast
Comparison: None.

CLINICAL DATA: 75-year-old Caucasian male with history of
hyperlipidemia and former smoking.

EXAM:
CT CARDIAC CORONARY ARTERY CALCIUM SCORE
TECHNIQUE: Non-contrast imaging through the heart was performed using
prospective ECG gating. Image post processing was performed on an
independent workstation, allowing for quantitative analysis of the
heart and coronary arteries. Note that this exam targets the heart
and the chest was not imaged in its entirety.

[Series 3: calcium scoring 2.00 br40 bestdiast 70% axial · axial · 0.72mm/px · z∈[+1752,+1792]mm · 2 of 60 slices shown, 3 images]
[im 20/60  vessel]
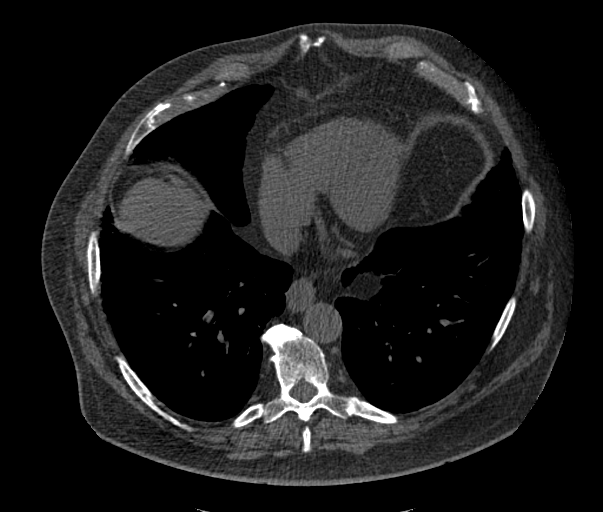
[im 20/60  lung]
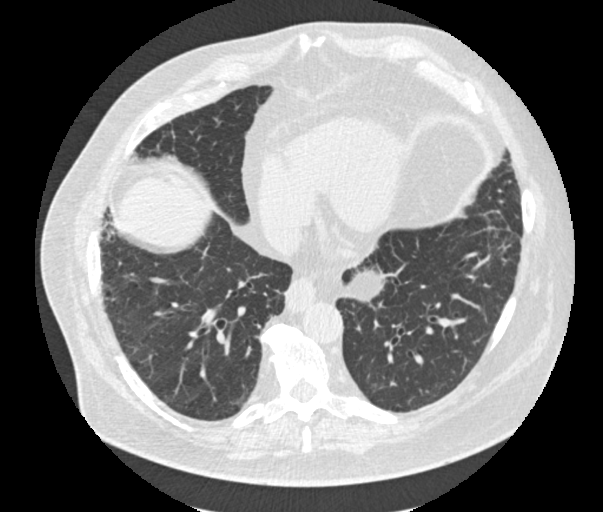
[im 40/60  vessel]
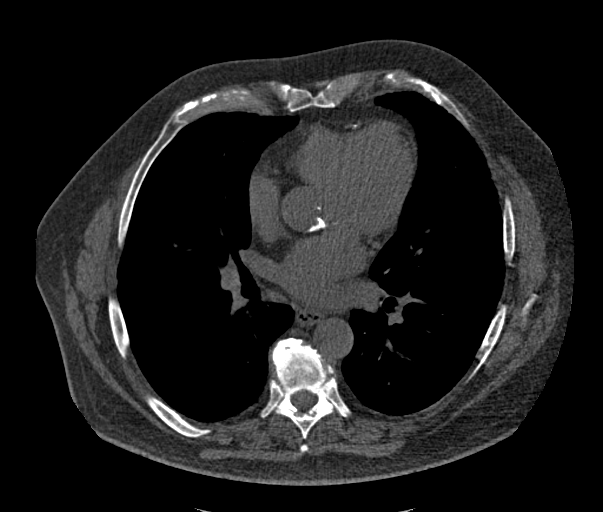

[Series 9: calcium scoring 2.00 br60 bestdiast 70% lungs · axial · 0.40mm/px · z∈[+1752,+1792]mm · 2 of 60 slices shown]
[im 20/60  vessel]
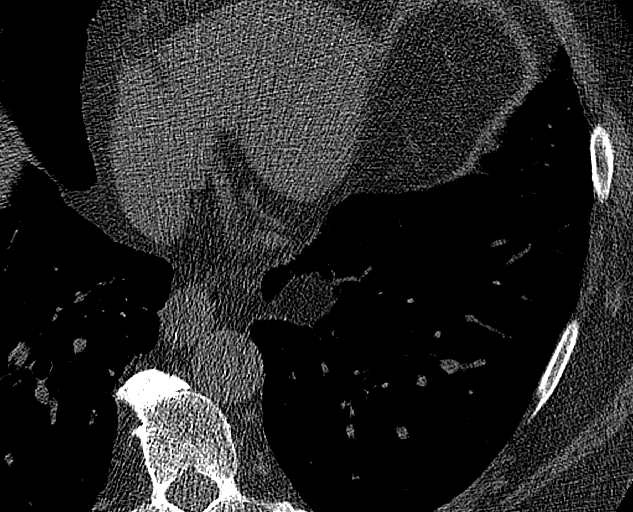
[im 40/60  vessel]
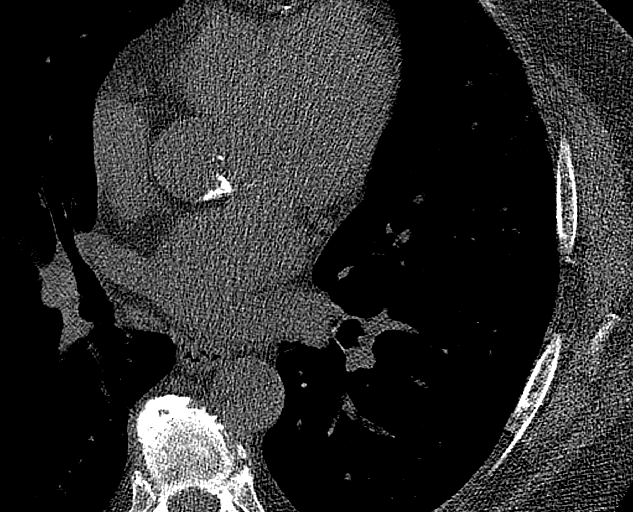

[4 of 20 positions shown; findings below may reference images not displayed]

FINDINGS: CORONARY CALCIUM SCORES:

Left Main: 0

LAD: 408

LCx: 183

RCA: 127

Total Agatston Score: 718

[HOSPITAL] percentile: 72

AORTA MEASUREMENTS:

Ascending Aorta: 33 mm

Descending Aorta: 28 mm

OTHER FINDINGS:

The heart size is within normal limits. No pericardial fluid
identified. The aortic valve, aortic annulus and mitral annulus
demonstrate calcifications. Visualized segments of the thoracic
aorta and central pulmonary arteries demonstrate normal caliber.
Visualized mediastinum and hilar regions show no lymphadenopathy or
masses. Small to moderate hiatal hernia present. Subpleural
interstitial thickening present suggestive of chronic lung disease.
There also may be a component of developing fibrotic lung disease at
the lung bases. Scattered pulmonary scarring present bilaterally.
Visualized lungs show no evidence of pulmonary edema, consolidation,
pneumothorax, nodule or pleural fluid. Visualized upper abdomen and
bony structures are unremarkable.
IMPRESSION: 1. Coronary calcium score of 718 is at the 72nd percentile for the
patient's age, sex and race.
2. Calcifications involving the aortic valve, aortic annulus and
mitral annulus. Correlation with echocardiography may be helpful to
evaluate valve function.
3. Subpleural interstitial thickening with potential developing
areas of fibrosis. Findings are consistent with chronic interstitial
lung disease.
4. Small to moderate hiatal hernia.

## 2021-08-08 ENCOUNTER — Encounter: Payer: Self-pay | Admitting: Cardiology

## 2021-08-08 ENCOUNTER — Other Ambulatory Visit: Payer: Self-pay

## 2021-08-08 ENCOUNTER — Ambulatory Visit: Payer: Medicare HMO | Admitting: Cardiology

## 2021-08-08 VITALS — BP 140/77 | HR 88 | Ht 74.0 in | Wt 223.4 lb

## 2021-08-08 DIAGNOSIS — R931 Abnormal findings on diagnostic imaging of heart and coronary circulation: Secondary | ICD-10-CM

## 2021-08-08 DIAGNOSIS — R030 Elevated blood-pressure reading, without diagnosis of hypertension: Secondary | ICD-10-CM | POA: Diagnosis not present

## 2021-08-08 DIAGNOSIS — I351 Nonrheumatic aortic (valve) insufficiency: Secondary | ICD-10-CM | POA: Diagnosis not present

## 2021-08-08 DIAGNOSIS — E785 Hyperlipidemia, unspecified: Secondary | ICD-10-CM

## 2021-08-08 NOTE — Progress Notes (Signed)
Primary Care Provider: Haywood Pao, MD Cardiologist: Glenetta Hew, MD Electrophysiologist: None  Clinic Note: Chief Complaint  Patient presents with   Follow-up    6 months.  Discussed echo results.   Coronary Artery Disease    Coronary Calcium Score over 700.  No angina   Aortic Stenosis    Echo result shows mild AS.   ===================================  ASSESSMENT/PLAN   Problem List Items Addressed This Visit       Cardiology Problems   Agatston coronary artery calcium score greater than 400 - Primary (Chronic)    Significant bempedoic acid: No active symptoms of obstructive CAD.  Again not threshold to consider ischemic evaluation with Coronary CTA if he were to have any symptoms to suggest exertional dyspnea or chest pain.  Thankfully, he is very active and has not had any concerning symptoms.  Plan: Rest factor management Continue aggressive lipid management-currently on Krill oil and statin although he may be having some weird symptoms Baby aspirin 81 mg. Currently blood pressure is pretty stable, but if indicated would likely start low-dose carvedilol --> however, with him being very active and completely asymptomatic despite vigorous exercise, I would like to hold off on beta-blocker.      Relevant Medications   sildenafil (VIAGRA) 100 MG tablet   Hyperlipidemia with target LDL less than 70 (Chronic)    We are now targeting LDL less than 70, and protection less than 55 based on elevated coronary calcium score.  Most recent LDL was 130, started on rosuvastatin.  Also increased her low-dose to 1 g.  He is noticing a lot of flatulence which may or may not be related.  [place] statin.  Not a usual symptom associated with statin. I think is probably more related to the facial metoprolol 500 mg capsule.  Should be due for having labs checked in late January for his PCP follow-up in early February..  Requiring additional therapy.  If the Chronic, can try a  short cessation of statin and reassess flatulence      Relevant Medications   sildenafil (VIAGRA) 100 MG tablet     Other   Aortic ejection murmur (Chronic)    Echo confirms mild AS.  Stenosis.  Can probably follow-up in 1 year we will plan to follow-up echo in summer 2025, unless murmur worsens.      Elevated blood pressure reading without diagnosis of hypertension (Chronic)    Low threshold for probably adding ARB since her ongoing beta-blocker to avoid fatigue.  Pressures at home seem to be well within goal.  Likely has whitecoat hypertension.       ===================================  HPI:    Carlos [name] is a 75 y.o. male with a PMH with CRF of HLD Found to Have High Elevated Coronary Calcium Score (718) who presents today for 4-month follow-up to discuss results of echocardiogram..  Carlos [name] was last seen on 02/07/2021 to discuss results of his Coronary Calcium Score with also evidence of calcified valvular disease.  He had multiple questions that were asked and answered.  He has been moaning intact since the onset of COVID-19 he was not able to continue going to his gym ([employer]) like he used to.  He was usually very very active exercising at least 5 to 6 days a week doing more than 30 minutes on, bicycle but also doing weight training exercises. -> He noted having whitecoat hypertension with home blood pressures ranging in the 120 to 130 mmHg range. I  started him on Crestor 20 mg daily along with aspirin 81 mg and suggested that he increase his co-Q10.  We also talked about increasing the krill oil to 1 g a day.  Recent Hospitalizations: None  Reviewed  CV studies:    The following studies were reviewed today: (if available, images/films reviewed: From Epic Chart or Care Everywhere) TTE 03/03/2021: EF 60 to 65%.  No R WMA.  GR 1 DD.  Unable to fully assess RV pressures.  Normal function.  Sever AoV thickening with mild AR (mean AOV gradient 12.4 mmHg ->  estimated AVA by VTI 1.60 cm)  Interval History:   Carlos [name] returns here today for clinic follow-up doing really well.  He is back to working out at the gym 5 to 6 days a week.  Not having any symptoms whatsoever of chest pain pressure or dyspnea with rest or exertion.  He says he really pushes in on the stationary bike, and is actually gone further than he is minimally before.  He has no exertional dyspnea.  No PND, orthopnea or edema.  No palpitations or irregular heartbeats.  He shows me his blood pressure log at home and the lowest blood pressure was 126/74 with the highest 138/70 8 mmHg.  This was over the last month.  No headaches or blurred vision or dizziness.  He has noted recurrent flatulence since starting Crestor and increasing the krill to 1 g, but has not had any myalgias or arthralgias.  CV Review of Symptoms (Summary) Cardiovascular ROS: no chest pain or dyspnea on exertion negative for - edema, irregular heartbeat, palpitations, paroxysmal nocturnal dyspnea, rapid heart rate, or shortness of breath; syncope/near syncope or TIA/amaurosis fugax, claudication  REVIEWED OF SYSTEMS   Review of Systems  Constitutional:  Positive for weight loss (Not as much recently, but more so over the last several months). Negative for malaise/fatigue.  HENT:  Negative for congestion and nosebleeds.   Respiratory:  Negative for cough and sputum production.   Cardiovascular:        Per HPI  Gastrointestinal:  Negative for blood in stool, constipation, melena, nausea and vomiting.       Fluctuance  Genitourinary:  Negative for frequency and hematuria.  Neurological:  Negative for dizziness, focal weakness and headaches.  Psychiatric/Behavioral:  Negative for memory loss. The patient is not nervous/anxious and does not have insomnia.        Just very sad because his 3 year old son has been dealing with cancer the last 5 years.  Has been doing very well, but has recently taken a  turn for the worse.   I have reviewed and (if needed) personally updated the patient's problem list, medications, allergies, past medical and surgical history, social and family history.   PAST MEDICAL HISTORY   Past Medical History:  Diagnosis Date   Arthritis    Coronary artery disease 12/2020   Coronary calcium score of 718   Erectile dysfunction    Hx of adenomatous colonic polyps 11/08/2018   Dr. Henrene Pastor   Hyperlipidemia    Hypertension    Osteoarthritis of bilateral glenohumeral joints    Also both knees   Seasonal allergies     PAST SURGICAL HISTORY   Past Surgical History:  Procedure Laterality Date   COLONOSCOPY  05/13/2013   HERNIA REPAIR Right 1997   POLYPECTOMY     TRANSTHORACIC ECHOCARDIOGRAM  02/2021   EF 60 to 65%.  No R WMA.  GR 1 DD.  Unable to  fully assess RV pressures.  Normal function.  Sever AoV thickening with mild AR (mean AOV gradient 12.4 mmHg -> estimated AVA by VTI 1.60 cm)    Immunization History  Administered Date(s) Administered   PFIZER(Purple Top)SARS-COV-2 Vaccination 10/26/2019, 12/02/2019    MEDICATIONS/ALLERGIES   Current Meds  Medication Sig   Ascorbic Acid (VITAMIN C) 1000 MG tablet Take 1,000 mg by mouth daily.   aspirin EC 81 MG tablet Take 1 tablet (81 mg total) by mouth daily.   betamethasone dipropionate 0.05 % cream Apply topically 2 (two) times daily as needed (Rash).   Cholecalciferol (VITAMIN D-3 PO) Take by mouth.   Coenzyme Q10-Vitamin E (QUNOL ULTRA COQ10 PO) Take by mouth.   Fluocinolone Acetonide 0.01 % OIL Place 1 application in ear(s) every morning.   fluticasone (FLONASE) 50 MCG/ACT nasal spray Place 2 sprays into both nostrils daily.   KRILL OIL PO Take by mouth.   multivitamin-lutein (OCUVITE-LUTEIN) CAPS capsule Take 2 capsules by mouth daily.   rosuvastatin (CRESTOR) 20 MG tablet Take 20 mg by mouth daily.   sildenafil (VIAGRA) 100 MG tablet Take 100 mg by mouth daily as needed.   TURMERIC PO Take by mouth.    [DISCONTINUED] Coenzyme Q10 (CO Q 10) 100 MG CAPS Take 300 mg by mouth daily at 12 noon.    No Known Allergies  SOCIAL HISTORY/FAMILY HISTORY   Reviewed in Epic:  Pertinent findings:  Social History   Tobacco Use   Smoking status: Never   Smokeless tobacco: Never  Vaping Use   Vaping Use: Never used  Substance Use Topics   Alcohol use: Not Currently    Comment: rarely   Drug use: No   Social History   Social History Narrative   Married father of 2.   [employer] of [employer]    OBJCTIVE -PE, EKG, labs   Wt Readings from Last 3 Encounters:  08/08/21 223 lb 6.4 oz (101.3 kg)  02/07/21 225 lb 3.2 oz (102.2 kg)  11/04/18 250 lb (113.4 kg)    Physical Exam: BP 140/77    Pulse 88    Ht 6\' 2"  (1.88 m)    Wt 223 lb 6.4 oz (101.3 kg)    SpO2 98%    BMI 28.68 kg/m  Physical Exam Vitals reviewed.  Constitutional:      General: He is not in acute distress.    Appearance: Normal appearance. He is normal weight.  HENT:     Head: Normocephalic and atraumatic.  Neck:     Vascular: No carotid bruit.  Cardiovascular:     Rate and Rhythm: Normal rate. Rhythm irregular.     Pulses: Normal pulses.     Heart sounds: Murmur (2/6 SEM at RUSB-neck) heard.    No friction rub. No gallop.  Pulmonary:     Effort: Pulmonary effort is normal. No respiratory distress.     Breath sounds: Normal breath sounds.  Chest:     Chest wall: No tenderness.  Musculoskeletal:     Cervical back: Normal range of motion and neck supple.  Neurological:     General: No focal deficit present.     Mental Status: He is alert and oriented to person, place, and time.  Psychiatric:        Mood and Affect: Mood normal.        Behavior: Behavior normal.        Thought Content: Thought content normal.        Judgment:  Judgment normal.     Adult ECG Report none  Recent Labs:    - no new labs since Jan 2022 09/14/2020 Na+ 141, K+ 4.6, Cl- 102, HCO3- 20 , BUN 15, Cr 1.04,  Glu 111 (h), Ca2+ 9.3; AST 23, ALT 27, AlkP 67; T Bili 0.4, TP 6.8 PSA 1.594 CBC: W 3.8, H/H 13.1/40.8, Plt 65* TC 218, TG 244, HDL 39, LDL 130; ApoB 137  ==================================================  COVID-19 Education: The signs and symptoms of COVID-19 were discussed with the patient and how to seek care for testing (follow up with PCP or arrange E-visit).    I spent a total of 27 minutes with the patient spent in direct patient consultation.  Additional time spent with chart review  / charting (studies, outside notes, etc): 15 min Total Time: 42 min  Current medicines are reviewed at length with the patient today.  (+/- concerns) - weird Sx with Statin - flatulence.  This visit occurred during the SARS-CoV-2 public health emergency.  Safety protocols were in place, including screening questions prior to the visit, additional usage of staff PPE, and extensive cleaning of exam room while observing appropriate contact time as indicated for disinfecting solutions.  Notice: This dictation was prepared with Dragon dictation along with smart phrase technology. Any transcriptional errors that result from this process are unintentional and may not be corrected upon review.  Studies Ordered:   No orders of the defined types were placed in this encounter.   Patient Instructions / Medication Changes & Studies & Tests Ordered   Patient Instructions  Medication Instructions:   No change *If you need a refill on your cardiac medications before your next appointment, please call your pharmacy*   Lab Work:  Not needed   Testing/Procedures:  Not needed  Follow-Up: At [facility], you and your health needs are our priority.  As part of our continuing mission to provide you with exceptional heart care, we have created designated Provider Care Teams.  These Care Teams include your primary Cardiologist (physician) and Advanced Practice Providers (APPs -  Physician Assistants and  Nurse Practitioners) who all work together to provide you with the care you need, when you need it.     Your next appointment:   7 month(s)  The format for your next appointment:   In Person  Provider:   Glenetta Hew, MD        Glenetta Hew, M.D., M.S. Interventional Cardiologist   Pager # [phone] Phone # [phone] [address] [place], [place] [address]   Thank you for choosing Heartcare at [facility]!!

## 2021-08-08 NOTE — Patient Instructions (Signed)
Medication Instructions:   No change *If you need a refill on your cardiac medications before your next appointment, please call your pharmacy*   Lab Work:  Not needed   Testing/Procedures:  Not needed  Follow-Up: At [facility], you and your health needs are our priority.  As part of our continuing mission to provide you with exceptional heart care, we have created designated Provider Care Teams.  These Care Teams include your primary Cardiologist (physician) and Advanced Practice Providers (APPs -  Physician Assistants and Nurse Practitioners) who all work together to provide you with the care you need, when you need it.     Your next appointment:   7 month(s)  The format for your next appointment:   In Person  Provider:   Glenetta Hew, MD

## 2021-08-09 ENCOUNTER — Encounter: Payer: Self-pay | Admitting: Cardiology

## 2021-08-09 NOTE — Assessment & Plan Note (Addendum)
We are now targeting LDL less than 70, and protection less than 55 based on elevated coronary calcium score.  Most recent LDL was 130, started on rosuvastatin.  Also increased her low-dose to 1 g.  He is noticing a lot of flatulence which may or may not be related.  [place] statin.  Not a usual symptom associated with statin. I think is probably more related to the facial metoprolol 500 mg capsule.  Should be due for having labs checked in late January for his PCP follow-up in early February..  Requiring additional therapy.  If the Chronic, can try a short cessation of statin and reassess flatulence

## 2021-08-09 NOTE — Assessment & Plan Note (Signed)
Echo confirms mild AS.  Stenosis.  Can probably follow-up in 1 year we will plan to follow-up echo in summer 2025, unless murmur worsens.

## 2021-08-09 NOTE — Assessment & Plan Note (Signed)
Significant bempedoic acid: No active symptoms of obstructive CAD.  Again not threshold to consider ischemic evaluation with Coronary CTA if he were to have any symptoms to suggest exertional dyspnea or chest pain.  Thankfully, he is very active and has not had any concerning symptoms.  Plan: Rest factor management  Continue aggressive lipid management-currently on Krill oil and statin although he may be having some weird symptoms  Baby aspirin 81 mg.  Currently blood pressure is pretty stable, but if indicated would likely start low-dose carvedilol --> however, with him being very active and completely asymptomatic despite vigorous exercise, I would like to hold off on beta-blocker.

## 2021-08-09 NOTE — Assessment & Plan Note (Signed)
Low threshold for probably adding ARB since her ongoing beta-blocker to avoid fatigue.  Pressures at home seem to be well within goal.  Likely has whitecoat hypertension.

## 2021-09-28 DIAGNOSIS — R7989 Other specified abnormal findings of blood chemistry: Secondary | ICD-10-CM | POA: Diagnosis not present

## 2021-09-28 DIAGNOSIS — E7800 Pure hypercholesterolemia, unspecified: Secondary | ICD-10-CM | POA: Diagnosis not present

## 2021-09-28 DIAGNOSIS — Z125 Encounter for screening for malignant neoplasm of prostate: Secondary | ICD-10-CM | POA: Diagnosis not present

## 2021-10-06 DIAGNOSIS — Z0000 Encounter for general adult medical examination without abnormal findings: Secondary | ICD-10-CM | POA: Diagnosis not present

## 2021-10-06 DIAGNOSIS — Z1339 Encounter for screening examination for other mental health and behavioral disorders: Secondary | ICD-10-CM | POA: Diagnosis not present

## 2021-10-06 DIAGNOSIS — I359 Nonrheumatic aortic valve disorder, unspecified: Secondary | ICD-10-CM | POA: Diagnosis not present

## 2021-10-06 DIAGNOSIS — Z1331 Encounter for screening for depression: Secondary | ICD-10-CM | POA: Diagnosis not present

## 2021-10-06 DIAGNOSIS — D72819 Decreased white blood cell count, unspecified: Secondary | ICD-10-CM | POA: Diagnosis not present

## 2021-10-06 DIAGNOSIS — I2510 Atherosclerotic heart disease of native coronary artery without angina pectoris: Secondary | ICD-10-CM | POA: Diagnosis not present

## 2021-10-06 DIAGNOSIS — R82998 Other abnormal findings in urine: Secondary | ICD-10-CM | POA: Diagnosis not present

## 2021-10-06 DIAGNOSIS — M160 Bilateral primary osteoarthritis of hip: Secondary | ICD-10-CM | POA: Diagnosis not present

## 2021-10-06 DIAGNOSIS — F5221 Male erectile disorder: Secondary | ICD-10-CM | POA: Diagnosis not present

## 2021-10-06 DIAGNOSIS — E669 Obesity, unspecified: Secondary | ICD-10-CM | POA: Diagnosis not present

## 2021-10-06 DIAGNOSIS — E7800 Pure hypercholesterolemia, unspecified: Secondary | ICD-10-CM | POA: Diagnosis not present

## 2021-10-06 DIAGNOSIS — M67431 Ganglion, right wrist: Secondary | ICD-10-CM | POA: Diagnosis not present

## 2021-10-06 DIAGNOSIS — Z1212 Encounter for screening for malignant neoplasm of rectum: Secondary | ICD-10-CM | POA: Diagnosis not present

## 2021-10-06 DIAGNOSIS — R69 Illness, unspecified: Secondary | ICD-10-CM | POA: Diagnosis not present

## 2022-01-03 ENCOUNTER — Encounter: Payer: Self-pay | Admitting: Dermatology

## 2022-01-03 ENCOUNTER — Ambulatory Visit: Payer: Medicare HMO | Admitting: Dermatology

## 2022-01-03 DIAGNOSIS — L821 Other seborrheic keratosis: Secondary | ICD-10-CM

## 2022-01-03 DIAGNOSIS — L738 Other specified follicular disorders: Secondary | ICD-10-CM | POA: Diagnosis not present

## 2022-01-03 DIAGNOSIS — L299 Pruritus, unspecified: Secondary | ICD-10-CM | POA: Diagnosis not present

## 2022-01-03 DIAGNOSIS — Z1283 Encounter for screening for malignant neoplasm of skin: Secondary | ICD-10-CM | POA: Diagnosis not present

## 2022-01-03 DIAGNOSIS — L309 Dermatitis, unspecified: Secondary | ICD-10-CM | POA: Diagnosis not present

## 2022-01-03 MED ORDER — FLUOCINOLONE ACETONIDE BODY 0.01 % EX OIL: TOPICAL_OIL | CUTANEOUS | 5 refills | Status: AC

## 2022-01-03 MED ORDER — BETAMETHASONE DIPROPIONATE 0.05 % EX CREA: TOPICAL_CREAM | Freq: Two times a day (BID) | CUTANEOUS | 3 refills | Status: AC | PRN

## 2022-01-23 ENCOUNTER — Encounter: Payer: Self-pay | Admitting: Dermatology

## 2022-01-23 NOTE — Progress Notes (Signed)
   Follow-Up Visit   Subjective  Carlos [name] is a 76 y.o. male who presents for the following: Annual Exam (No new concerns).  General skin examination Location:  Duration:  Quality:  Associated Signs/Symptoms: Modifying Factors:  Severity:  Timing: Context:   Objective  Well appearing patient in no apparent distress; mood and affect are within normal limits. Full body skin examination: No atypical pigmented lesions (all checked with dermoscopy), no nonmelanoma skin cancer.  Several flattopped textured brown papules, compatible dermoscopy  Mid Back Itching with some subtle dermatitis which may be secondary to scratching.  Left Concha Inflammation and scale compatible with seborrheic dermatitis; history of good response to topical fluocinolone  Mid Forehead Several 2 mm flesh-colored papules with eccentric dell    A full examination was performed including scalp, head, eyes, ears, nose, lips, neck, chest, axillae, abdomen, back, buttocks, bilateral upper extremities, bilateral lower extremities, hands, feet, fingers, toes, fingernails, and toenails. All findings within normal limits unless otherwise noted below.   Assessment & Plan    Pruritus Mid Back  Okay to use a topical steroid after bathing when there is visible rash.  If this does not help, he will look for a nonprescription topical containing the ingredient pramoxine for the possibility of notalgia.  Related Medications betamethasone dipropionate 0.05 % cream Apply topically 2 (two) times daily as needed (Rash).  Encounter for screening for malignant neoplasm of skin  Annual skin examination  Dermatitis Left Concha  Okay to refill fluocinolone.  Do not use on face.  Related Medications Fluocinolone Acetonide Body 0.01 % OIL Apply to inner ear qd  Seborrheic keratosis  Leave if stable  Sebaceous hyperplasia of face Mid Forehead  Told of similar appearance of early BCC so if any lesion  grows or bleeds return for biopsy      I, Lavonna Monarch, MD, have reviewed all documentation for this visit.  The documentation on 01/23/22 for the exam, diagnosis, procedures, and orders are all accurate and complete.

## 2022-01-24 DIAGNOSIS — H43813 Vitreous degeneration, bilateral: Secondary | ICD-10-CM | POA: Diagnosis not present

## 2022-01-24 DIAGNOSIS — H2513 Age-related nuclear cataract, bilateral: Secondary | ICD-10-CM | POA: Diagnosis not present

## 2022-01-24 DIAGNOSIS — H25013 Cortical age-related cataract, bilateral: Secondary | ICD-10-CM | POA: Diagnosis not present

## 2022-01-24 DIAGNOSIS — H5213 Myopia, bilateral: Secondary | ICD-10-CM | POA: Diagnosis not present

## 2022-05-01 ENCOUNTER — Ambulatory Visit: Payer: Medicare HMO | Attending: Cardiology | Admitting: Cardiology

## 2022-05-01 ENCOUNTER — Encounter: Payer: Self-pay | Admitting: Cardiology

## 2022-05-01 VITALS — BP 128/82 | HR 74 | Ht 74.0 in | Wt 223.0 lb

## 2022-05-01 DIAGNOSIS — E785 Hyperlipidemia, unspecified: Secondary | ICD-10-CM

## 2022-05-01 DIAGNOSIS — R030 Elevated blood-pressure reading, without diagnosis of hypertension: Secondary | ICD-10-CM

## 2022-05-01 DIAGNOSIS — R142 Eructation: Secondary | ICD-10-CM | POA: Diagnosis not present

## 2022-05-01 DIAGNOSIS — I351 Nonrheumatic aortic (valve) insufficiency: Secondary | ICD-10-CM | POA: Diagnosis not present

## 2022-05-01 DIAGNOSIS — R141 Gas pain: Secondary | ICD-10-CM | POA: Diagnosis not present

## 2022-05-01 DIAGNOSIS — R143 Flatulence: Secondary | ICD-10-CM

## 2022-05-01 DIAGNOSIS — R931 Abnormal findings on diagnostic imaging of heart and coronary circulation: Secondary | ICD-10-CM

## 2022-05-01 MED ORDER — ROSUVASTATIN CALCIUM 20 MG PO TABS: 10.0000 mg | ORAL_TABLET | Freq: Every day | ORAL | 6 refills | Status: DC

## 2022-05-01 NOTE — Progress Notes (Signed)
Primary Care Provider: Tisovec, Carlos Him, MD Carlos [name] cardiologist: Carlos Hew, MD Electrophysiologist: None  Clinic Note: Chief Complaint  Patient presents with   Follow-up    Doing well.  Major complaint is flatulence that he blames on Crestor   Coronary Artery Disease    Elevated Coronary Calcium Score.  No angina   ===================================  ASSESSMENT/PLAN   Problem List Items Addressed This Visit       Cardiology Problems   Agatston coronary artery calcium score greater than 400 - Primary (Chronic)    Significantly elevated Coronary Calcium Score of over 700.  Completely asymptomatic with being relatively energetic and active.  As such, no need for full ischemic evaluation at this point.  Simply continue to treat risk factors.  Plan: Risk Factor Management. Blood pressure controlled. Lipids look great-but he is having issues with medications.  We will try a statin holiday for 1 month to reassess. Aspirin 81 mg daily      Relevant Medications   rosuvastatin (CRESTOR) 20 MG tablet   Other Relevant Orders   EKG 12-Lead (Completed)   Hyperlipidemia with target LDL less than 70 (Chronic)    LDL look great on last check at 66.  Unfortunately he is having symptoms with rosuvastatin.  Not a usual side effect of statins, but we will therefore give a trial of statin holiday for a month.  He took a long time to convince that this is a reasonable option because he did not like and lipids to go backwards.  I reassured [name] that he is okay holding for 1 month because that is the only way for [place] to clearly understand if this is was causing his symptoms.  I have personally think the symptoms are related to [place] oil.  Plan: 1 month statin holiday.  If symptoms do improve then I would like to retry 10 mg of rosuvastatin.  If they do not change, then restart full dose and trial run over stopping Krill oil.  Continue to have labs checked and follow-up with  PCP.      Relevant Medications   rosuvastatin (CRESTOR) 20 MG tablet   Other Relevant Orders   EKG 12-Lead (Completed)     Other   Flatulence, eructation, and gas pain    He think this is related to medication-potentially rosuvastatin.  We will have him do a statin holiday for 1 month and reassess.      Aortic ejection murmur (Chronic)    Mild aortic stenosis on recent echo.  Would be due for follow-up echo in 2025.      Relevant Orders   EKG 12-Lead (Completed)   Elevated blood pressure reading without diagnosis of hypertension (Chronic)    Blood pressures look pretty well controlled.  Borderline but not hypertensive.  For now we will hold off on antihypertensives.       ===================================  HPI:    Carlos [name] is a 76 y.o. male with CRFs of HLD & Elevated Coronary Calcium Score below who presents today for 9 month f/u.  Carlos [name] was last seen in Dec 2022: He was doing really well.  Was back to working out at the gym 5 to 6 days a week.  Not having any symptoms whatsoever of chest pain pressure or dyspnea with rest or exertion.  He usually pushes himself quite a bit on the stationary bike, and is actually gone further than he is minimally before.  No exertional dyspnea.  No PND,  orthopnea or edema.  No palpitations or irregular heartbeats.  BP log range 126/74 -138/78 mmHg over the preceding month.  No headaches or blurred vision or dizziness.  Complained of flatulence since starting Crestor and increasing krill oil to 1 g. Continue to target the LDL<70 (LDL was 66).. Thought to have some whitecoat hypertension   Recent Hospitalizations: None  Reviewed  CV studies:    The following studies were reviewed today: (if available, images/films reviewed: From Epic Chart or Care Everywhere) None:  Interval History:   Carlos [name] returns here today for routine follow-up stating that he has not been as active over the last several  months.  His son passed away in 2022-10-11 after long bout with cancer (apparently he died from sepsis related to foreign body left behind after surgery).  This is been a very tough thing for [name] to deal with.  He and his wife have gone down several times to help his daughter-in-law and grandkids move. He had been doing really well with exercise, but now has been trying to build back up to exercise.  He has plans to do lots of traveling over the next several months as well.  He really denies any cardiac issues of chest pain pressure or dyspnea with rest or exertion.  No PND, orthopnea edema and no palpitations. The main symptom he notes is having paroxysmal's of significant flatulence that lead to loose stool/diarrhea that can come on randomly with no warning.  He thinks that this is related to statin, indicating that he had been on fish oil or Krill oil for years before statin and did not have problems.  He says his blood pressure log for the most part has been 120/60 with a rate of 67 bpm too high at 127/73 with a rate of 77 bpm.  Today's blood pressures is probably little high for [name].  CV Review of Symptoms (Summary): no chest pain or dyspnea on exertion positive for - some mild exercise intolerance because of less exercise.  Flatulence and loose stool negative for - edema, irregular heartbeat, orthopnea, palpitations, paroxysmal nocturnal dyspnea, rapid heart rate, shortness of breath, or syncope/near syncope, TIA/amaurosis fugax or claudication.  REVIEWED OF SYSTEMS   Review of Systems  Constitutional:  Negative for malaise/fatigue and weight loss (Weight has been stable.).  HENT:  Negative for congestion and nosebleeds.   Respiratory:  Negative for cough and shortness of breath.   Gastrointestinal:  Positive for diarrhea.       Bouts of flatulence  Musculoskeletal:  Positive for joint pain. Negative for myalgias.  Neurological:  Negative for dizziness and focal weakness.   Psychiatric/Behavioral:  Negative for depression and memory loss. The patient is not nervous/anxious.        Lots of psychosocial stress dealing with his son's death, but stable.  Still sad about his son's death but actively working to help care for his grandkids and daughter-in-law.   I have reviewed and (if needed) personally updated the patient's problem list, medications, allergies, past medical and surgical history, social and family history.   PAST MEDICAL HISTORY   Past Medical History:  Diagnosis Date   Arthritis    Coronary artery disease 12/2020   Coronary calcium score of 718   Erectile dysfunction    Hx of adenomatous colonic polyps 11/08/2018   Dr. Henrene Pastor   Hyperlipidemia    Hypertension    Osteoarthritis of bilateral glenohumeral joints    Also both knees   Seasonal allergies  PAST SURGICAL HISTORY   Past Surgical History:  Procedure Laterality Date   COLONOSCOPY  05/13/2013   HERNIA REPAIR Right 1997   POLYPECTOMY     TRANSTHORACIC ECHOCARDIOGRAM  02/2021   EF 60 to 65%.  No R WMA.  GR 1 DD.  Unable to fully assess RV pressures.  Normal function.  Sever AoV thickening with mild AR (mean AOV gradient 12.4 mmHg -> estimated AVA by VTI 1.60 cm)    Immunization History  Administered Date(s) Administered   PFIZER(Purple Top)SARS-COV-2 Vaccination 10/26/2019, 12/02/2019    MEDICATIONS/ALLERGIES   Current Meds  Medication Sig   Ascorbic Acid (VITAMIN C) 1000 MG tablet Take 1,000 mg by mouth daily.   aspirin EC 81 MG tablet Take 1 tablet (81 mg total) by mouth daily.   betamethasone dipropionate 0.05 % cream Apply topically 2 (two) times daily as needed (Rash).   Cholecalciferol (VITAMIN D-3 PO) Take by mouth.   Coenzyme Q10-Vitamin E (QUNOL ULTRA COQ10 PO) Take by mouth.   Fluocinolone Acetonide 0.01 % OIL Place 1 application in ear(s) every morning.   Fluocinolone Acetonide Body 0.01 % OIL Apply to inner ear qd   fluticasone (FLONASE) 50 MCG/ACT nasal  spray Place 2 sprays into both nostrils daily.   KRILL OIL PO Take by mouth.   multivitamin-lutein (OCUVITE-LUTEIN) CAPS capsule Take 2 capsules by mouth daily.   rosuvastatin (CRESTOR) 20 MG tablet Take 20 mg by mouth daily.   sildenafil (VIAGRA) 100 MG tablet Take 100 mg by mouth daily as needed.   TURMERIC PO Take by mouth.    No Known Allergies  SOCIAL HISTORY/FAMILY HISTORY   Reviewed in Epic:  Pertinent findings:  Social History   Tobacco Use   Smoking status: Never   Smokeless tobacco: Never  Vaping Use   Vaping Use: Never used  Substance Use Topics   Alcohol use: Not Currently    Comment: rarely   Drug use: No   Social History   Social History Narrative   Married father of 2.   [employer] of [employer]    OBJCTIVE -PE, EKG, labs   Wt Readings from Last 3 Encounters:  05/01/22 223 lb (101.2 kg)  08/08/21 223 lb 6.4 oz (101.3 kg)  02/07/21 225 lb 3.2 oz (102.2 kg)    Physical Exam: BP 128/82   Pulse 74   Ht '6\' 2"'$  (1.88 m)   Wt 223 lb (101.2 kg)   SpO2 96%   BMI 28.63 kg/m  Physical Exam Constitutional:      General: He is not in acute distress.    Appearance: Normal appearance. He is normal weight. He is not ill-appearing or toxic-appearing.     Comments: Borderline obese.  Well-nourished and well-groomed.Marland Kitchen  HENT:     Head: Normocephalic and atraumatic.  Neck:     Vascular: No carotid bruit or JVD.  Cardiovascular:     Rate and Rhythm: Normal rate and regular rhythm. No extrasystoles are present.    Chest Wall: PMI is not displaced.     Pulses: Normal pulses.     Heart sounds: S1 normal and S2 normal. Murmur heard.     High-pitched harsh crescendo-decrescendo midsystolic murmur is present with a grade of 2/6 at the upper right sternal border radiating to the neck.     No friction rub. No gallop.  Pulmonary:     Effort: Pulmonary effort is normal. No respiratory distress.     Breath sounds: Normal breath sounds. No  wheezing, rhonchi or rales.  Musculoskeletal:        General: No swelling. Normal range of motion.     Cervical back: Normal range of motion and neck supple.  Skin:    General: Skin is warm and dry.  Neurological:     General: No focal deficit present.     Mental Status: He is alert and oriented to person, place, and time.     Gait: Gait normal.  Psychiatric:        Mood and Affect: Mood normal.        Behavior: Behavior normal.        Thought Content: Thought content normal.        Judgment: Judgment normal.     Adult ECG Report  Rate: 74 ;  Rhythm: normal sinus rhythm and incomplete RBBB.  Otherwise normal axis, intervals and durations. ;   Narrative Interpretation: Stable  Recent Labs:   09/28/2021: TC 124, TG 90, HDL 40, LDL 66. No results found for: "CHOL", "HDL", "LDLCALC", "LDLDIRECT", "TRIG", "CHOLHDL" No results found for: "CREATININE", "BUN", "NA", "K", "CL", "CO2"   No results found for: "HGBA1C" No results found for: "TSH"  ================================================== I spent a total of 22 minutes with the patient spent in direct patient consultation.  Additional time spent with chart review  / charting (studies, outside notes, etc): 20 min Total Time: 42 min  Current medicines are reviewed at length with the patient today.  (+/- concerns) none  Notice: This dictation was prepared with Dragon dictation along with smart phrase technology. Any transcriptional errors that result from this process are unintentional and may not be corrected upon review.  Studies Ordered:   Orders Placed This Encounter  Procedures   EKG 12-Lead   Meds ordered this encounter  Medications   rosuvastatin (CRESTOR) 20 MG tablet    Sig: Take 0.5 tablets (10 mg total) by mouth daily.    Dispense:  30 tablet    Refill:  6    Changed on 05/01/22    Patient Instructions / Medication Changes & Studies & Tests Ordered   Patient Instructions  Medication Instructions:    Take a  statin "holiday " - meaning do not take Rosuvastatin for one month . If you issue goes away  ,restart taking 1/2 dose  daily  for at least 2 months   if symptoms return contact office.    *If you need a refill on your cardiac medications before your next appointment, please call your pharmacy*   Lab Work: Not needed    Testing/Procedures: Not needed   Follow-Up: At [facility], you and your health needs are our priority.  As part of our continuing mission to provide you with exceptional heart care, we have created designated Provider Care Teams.  These Care Teams include your primary Cardiologist (physician) and Advanced Practice Providers (APPs -  Physician Assistants and Nurse Practitioners) who all work together to provide you with the care you need, when you need it.    Your next appointment:   10 month(s)  The format for your next appointment:   In Person  Provider:   Glenetta Hew, MD       Important Information About Sugar           Leonie Man, MD, MS Carlos [name], M.D., M.S. Interventional Cardiologist  [place]  Pager # [phone] Phone # [phone] [address], [place] [address]   Thank you for choosing  [employer] [employer] [facility] [employer]!!

## 2022-05-01 NOTE — Patient Instructions (Addendum)
Medication Instructions:    Take a statin "holiday " - meaning do not take Rosuvastatin for one month . If you issue goes away  ,restart taking 1/2 dose  daily  for at least 2 months   if symptoms return contact office.    *If you need a refill on your cardiac medications before your next appointment, please call your pharmacy*   Lab Work: Not needed    Testing/Procedures: Not needed   Follow-Up: At [facility], you and your health needs are our priority.  As part of our continuing mission to provide you with exceptional heart care, we have created designated Provider Care Teams.  These Care Teams include your primary Cardiologist (physician) and Advanced Practice Providers (APPs -  Physician Assistants and Nurse Practitioners) who all work together to provide you with the care you need, when you need it.    Your next appointment:   10 month(s)  The format for your next appointment:   In Person  Provider:   Glenetta Hew, MD       Important Information About Sugar

## 2022-05-04 ENCOUNTER — Encounter: Payer: Self-pay | Admitting: Cardiology

## 2022-05-04 NOTE — Assessment & Plan Note (Signed)
Significantly elevated Coronary Calcium Score of over 700.  Completely asymptomatic with being relatively energetic and active.  As such, no need for full ischemic evaluation at this point.  Simply continue to treat risk factors.  Plan: Risk Factor Management.  Blood pressure controlled.  Lipids look great-but he is having issues with medications.  We will try a statin holiday for 1 month to reassess.  Aspirin 81 mg daily

## 2022-05-04 NOTE — Assessment & Plan Note (Addendum)
LDL look great on last check at 66.  Unfortunately he is having symptoms with rosuvastatin.  Not a usual side effect of statins, but we will therefore give a trial of statin holiday for a month.  He took a long time to convince that this is a reasonable option because he did not like and lipids to go backwards.  I reassured him that he is okay holding for 1 month because that is the only way for [place] to clearly understand if this is was causing his symptoms.  I have personally think the symptoms are related to [place] oil.  Plan: 1 month statin holiday.  If symptoms do improve then I would like to retry 10 mg of rosuvastatin.  If they do not change, then restart full dose and trial run over stopping Kr[place] oil.  Continue to have labs checked and follow-up with PCP.

## 2022-05-04 NOTE — Assessment & Plan Note (Signed)
He think this is related to medication-potentially rosuvastatin.  We will have him do a statin holiday for 1 month and reassess.

## 2022-05-04 NOTE — Assessment & Plan Note (Signed)
Blood pressures look pretty well controlled.  Borderline but not hypertensive.  For now we will hold off on antihypertensives.

## 2022-05-04 NOTE — Assessment & Plan Note (Signed)
Mild aortic stenosis on recent echo.  Would be due for follow-up echo in 2025.

## 2022-05-24 ENCOUNTER — Encounter: Payer: Self-pay | Admitting: Cardiology

## 2022-06-10 DIAGNOSIS — Z23 Encounter for immunization: Secondary | ICD-10-CM | POA: Diagnosis not present

## 2022-08-24 DIAGNOSIS — D225 Melanocytic nevi of trunk: Secondary | ICD-10-CM | POA: Diagnosis not present

## 2022-08-24 DIAGNOSIS — L820 Inflamed seborrheic keratosis: Secondary | ICD-10-CM | POA: Diagnosis not present

## 2022-08-24 DIAGNOSIS — L309 Dermatitis, unspecified: Secondary | ICD-10-CM | POA: Diagnosis not present

## 2022-08-24 DIAGNOSIS — L298 Other pruritus: Secondary | ICD-10-CM | POA: Diagnosis not present

## 2022-08-24 DIAGNOSIS — L538 Other specified erythematous conditions: Secondary | ICD-10-CM | POA: Diagnosis not present

## 2022-08-24 DIAGNOSIS — L821 Other seborrheic keratosis: Secondary | ICD-10-CM | POA: Diagnosis not present

## 2022-08-24 DIAGNOSIS — Z789 Other specified health status: Secondary | ICD-10-CM | POA: Diagnosis not present

## 2022-08-24 DIAGNOSIS — L814 Other melanin hyperpigmentation: Secondary | ICD-10-CM | POA: Diagnosis not present

## 2022-10-03 DIAGNOSIS — Z125 Encounter for screening for malignant neoplasm of prostate: Secondary | ICD-10-CM | POA: Diagnosis not present

## 2022-10-03 DIAGNOSIS — E7800 Pure hypercholesterolemia, unspecified: Secondary | ICD-10-CM | POA: Diagnosis not present

## 2022-10-09 DIAGNOSIS — M160 Bilateral primary osteoarthritis of hip: Secondary | ICD-10-CM | POA: Diagnosis not present

## 2022-10-09 DIAGNOSIS — R82998 Other abnormal findings in urine: Secondary | ICD-10-CM | POA: Diagnosis not present

## 2022-10-09 DIAGNOSIS — D126 Benign neoplasm of colon, unspecified: Secondary | ICD-10-CM | POA: Diagnosis not present

## 2022-10-09 DIAGNOSIS — I2510 Atherosclerotic heart disease of native coronary artery without angina pectoris: Secondary | ICD-10-CM | POA: Diagnosis not present

## 2022-10-09 DIAGNOSIS — Z0000 Encounter for general adult medical examination without abnormal findings: Secondary | ICD-10-CM | POA: Diagnosis not present

## 2022-10-09 DIAGNOSIS — F5221 Male erectile disorder: Secondary | ICD-10-CM | POA: Diagnosis not present

## 2022-10-09 DIAGNOSIS — E7800 Pure hypercholesterolemia, unspecified: Secondary | ICD-10-CM | POA: Diagnosis not present

## 2022-10-09 DIAGNOSIS — Z23 Encounter for immunization: Secondary | ICD-10-CM | POA: Diagnosis not present

## 2022-10-09 DIAGNOSIS — D72819 Decreased white blood cell count, unspecified: Secondary | ICD-10-CM | POA: Diagnosis not present

## 2022-10-09 DIAGNOSIS — Z6830 Body mass index (BMI) 30.0-30.9, adult: Secondary | ICD-10-CM | POA: Diagnosis not present

## 2022-10-09 DIAGNOSIS — I359 Nonrheumatic aortic valve disorder, unspecified: Secondary | ICD-10-CM | POA: Diagnosis not present

## 2022-10-09 DIAGNOSIS — J309 Allergic rhinitis, unspecified: Secondary | ICD-10-CM | POA: Diagnosis not present

## 2022-10-09 DIAGNOSIS — E669 Obesity, unspecified: Secondary | ICD-10-CM | POA: Diagnosis not present

## 2022-10-09 DIAGNOSIS — Z1339 Encounter for screening examination for other mental health and behavioral disorders: Secondary | ICD-10-CM | POA: Diagnosis not present

## 2022-10-09 DIAGNOSIS — Z1331 Encounter for screening for depression: Secondary | ICD-10-CM | POA: Diagnosis not present

## 2022-10-09 DIAGNOSIS — M67431 Ganglion, right wrist: Secondary | ICD-10-CM | POA: Diagnosis not present

## 2022-10-11 ENCOUNTER — Telehealth: Payer: Self-pay | Admitting: Cardiology

## 2022-10-11 NOTE — Telephone Encounter (Signed)
Patient called to say he had his annual yearly physical and his LDL was up from what is was a year ago.  He wanted to make [place] aware of that.

## 2022-10-11 NOTE — Telephone Encounter (Signed)
Called patient, he states he recently seen PCP- patient aware we did not have a copy yet of the results, but I would send to MD/RN to see if it has come in.   He did state his LDL was now 23- PCP recommended to increase back to 20 mg crestor, he is currently taking 10 mg.   Will route to MD/RN. Thanks!

## 2022-10-12 NOTE — Telephone Encounter (Signed)
Lets see how he does on the 20 mg dose.  We backed him down on 10 because of symptoms.  Sometimes, when you go back up to the original dose, you do better the second time around

## 2022-10-16 NOTE — Telephone Encounter (Signed)
Called patient, advised of message from MD.  Patient verbalized understanding.   

## 2022-10-16 NOTE — Telephone Encounter (Signed)
Called patient, LVM to call back to discuss.   Left call back number.   

## 2023-01-11 DIAGNOSIS — L738 Other specified follicular disorders: Secondary | ICD-10-CM | POA: Diagnosis not present

## 2023-01-11 DIAGNOSIS — L821 Other seborrheic keratosis: Secondary | ICD-10-CM | POA: Diagnosis not present

## 2023-01-11 DIAGNOSIS — D1801 Hemangioma of skin and subcutaneous tissue: Secondary | ICD-10-CM | POA: Diagnosis not present

## 2023-01-11 DIAGNOSIS — D485 Neoplasm of uncertain behavior of skin: Secondary | ICD-10-CM | POA: Diagnosis not present

## 2023-01-31 DIAGNOSIS — H43813 Vitreous degeneration, bilateral: Secondary | ICD-10-CM | POA: Diagnosis not present

## 2023-01-31 DIAGNOSIS — H25013 Cortical age-related cataract, bilateral: Secondary | ICD-10-CM | POA: Diagnosis not present

## 2023-01-31 DIAGNOSIS — H524 Presbyopia: Secondary | ICD-10-CM | POA: Diagnosis not present

## 2023-01-31 DIAGNOSIS — H2513 Age-related nuclear cataract, bilateral: Secondary | ICD-10-CM | POA: Diagnosis not present

## 2023-01-31 DIAGNOSIS — H5213 Myopia, bilateral: Secondary | ICD-10-CM | POA: Diagnosis not present

## 2023-01-31 DIAGNOSIS — H1789 Other corneal scars and opacities: Secondary | ICD-10-CM | POA: Diagnosis not present

## 2023-03-01 DIAGNOSIS — H25012 Cortical age-related cataract, left eye: Secondary | ICD-10-CM | POA: Diagnosis not present

## 2023-03-01 DIAGNOSIS — H2512 Age-related nuclear cataract, left eye: Secondary | ICD-10-CM | POA: Diagnosis not present

## 2023-03-01 DIAGNOSIS — H25812 Combined forms of age-related cataract, left eye: Secondary | ICD-10-CM | POA: Diagnosis not present

## 2023-03-15 DIAGNOSIS — H2511 Age-related nuclear cataract, right eye: Secondary | ICD-10-CM | POA: Diagnosis not present

## 2023-03-15 DIAGNOSIS — H25811 Combined forms of age-related cataract, right eye: Secondary | ICD-10-CM | POA: Diagnosis not present

## 2023-03-15 DIAGNOSIS — H25011 Cortical age-related cataract, right eye: Secondary | ICD-10-CM | POA: Diagnosis not present

## 2023-05-06 NOTE — Progress Notes (Signed)
Cardiology Office Note:  .   Date:  05/14/2023  ID:  Carlos [name], DOB 1946/03/17, MRN [id number] PCP: Gaspar Garbe, MD-[facility] Providers Cardiologist:  Bryan Lemma, MD     Chief Complaint  Patient presents with   Follow-up    Overall stable.   Coronary Artery Disease    Coronary artery calcium score.  No symptoms of chest pain or pressure   Cardiac Valve Problem    Mild aortic stenosis    History of Present Illness: .     Carlos [name] is a  77 y.o. male  with a PMH notable for Elevated Coronary Calcium Score (718), with mild AS, and HLD who presents here for annual follow-up at the request of Tisovec, Adelfa Koh, MD.  [name] was last seen on May 01, 2022: I had not been very active over several months.  His son had passed away after a long bout against bone cancer.  He ended up having sepsis related to a foreign body being left behind.  This is very rough and he and his wife have been going down to try to help care for his grandchildren and help out [place] their daughter-in-law.  Was gradual try build back his exercise level.  Denied any cardiac symptoms.  No chest pain or pressure dyspnea with rest or exertion.  No PND orthopnea edema.-Having a lot of flatulence and loose stool/diarrhea.  Thought it was related to statin.  Labs from 09/28/2021: TC 124, TG 90, HDL 40, LDL 66. => Suggested that he do a 25-month statin holiday to assess symptoms.    Subjective  INTERVAL HISTORY [name] returns here for delayed annual follow-up.  Doing fairly well, but got out of the habit of doing exercise because of different things going on including recent cataract surgery.  He usually does stretching and walking on the almost daily basis.  He is definitely helping to get back into the gym, but just simply states that life has become too busy.  He said that he went back to taking a full 20 mg atorvastatin  noting no real change in symptoms.  This was increased back up in February. Cardiovascular ROS: no chest pain or dyspnea on exertion negative for - edema, irregular heartbeat, loss of consciousness, orthopnea, palpitations, paroxysmal nocturnal dyspnea, rapid heart rate, shortness of breath, or syncope, near syncope, TIA/Amaurosis fugax, claudication  ROS:  Review of Systems - Negative except mild joint pains -- no longer having flatulence & diarrhea     Objective  Studies Reviewed: Marland Kitchen   EKG Interpretation Date/Time:  Monday May 07 2023 08:20:30 EDT Ventricular Rate:  73 PR Interval:  188 QRS Duration:  94 QT Interval:  376 QTC Calculation: 414 R Axis:   1  Text Interpretation: Sinus rhythm with occasional Premature ventricular complexes No previous ECGs available Confirmed by Bryan Lemma ([id number]) on 05/07/2023 8:57:11 AM   Open studies: ECHO 03/03/2021: Normal LV size and function.  EF 60-65%.  No RWMA.  GR 1 DD.  Unable to assess PAP.  But normal RAP.  Severe AoV calcification with severe thickening.  Mild AS with mean AVG 12.4 mmHg. Coronary Calcium Score 01/13/2021: Total score 718: LAD 408, LCx 183, RCA 127.  Also noted AoV calcification.  Risk Assessment/Calculations:          Physical Exam:   VS:  BP 128/84   Pulse 73   Ht 6\' 2"  (1.88 m)   Wt  218 lb 3.2 oz (99 kg)   SpO2 97%   BMI 28.02 kg/m    Wt Readings from Last 3 Encounters:  05/07/23 218 lb 3.2 oz (99 kg)  05/01/22 223 lb (101.2 kg)  08/08/21 223 lb 6.4 oz (101.3 kg)    GEN: Well nourished, well developed in no acute distress; healthy appearing NECK: No JVD; No carotid bruits CARDIAC: Normal S1, S2; RRR, harsh 2/SEM at RUSB - neck, with no, rubs, gallops RESPIRATORY:  Clear to auscultation without rales, wheezing or rhonchi ; nonlabored, good air movement. ABDOMEN: Soft, non-tender, non-distended EXTREMITIES:  No edema; No deformity      ASSESSMENT AND PLAN: .    Problem List Items Addressed This  Visit       Cardiology Problems   Agatston coronary artery calcium score greater than 400 - Primary (Chronic)    Significant elevated Coronary Calcium Score.  Still somewhat asymptomatic and very energetic.  No plans for full ischemic evaluation in the absence of symptoms.  Continue to treat risk factors: Blood pressure seems controlled Keep an eye on lipids, LDL was 66 total cholesterol 124 on combination of statin and krill oil.  Plan to continue this for at least another year or so. Keep on 81 mg aspirin.       Relevant Medications   rosuvastatin (CRESTOR) 20 MG tablet   sildenafil (VIAGRA) 50 MG tablet   Other Relevant Orders   EKG 12-Lead (Completed)   ECHOCARDIOGRAM COMPLETE   Hyperlipidemia with target LDL less than 70 (Chronic)    Due for labs to be checked now.  This important to assess symptoms, dosing and complaints.  Continue rosuvastatin      Relevant Medications   rosuvastatin (CRESTOR) 20 MG tablet   sildenafil (VIAGRA) 50 MG tablet   Other Relevant Orders   EKG 12-Lead (Completed)     Other   Aortic ejection murmur (Chronic)    Due for follow-up evaluation.      Relevant Orders   EKG 12-Lead (Completed)   ECHOCARDIOGRAM COMPLETE   Elevated blood pressure reading without diagnosis of hypertension (Chronic)    Blood pressure looks good today.  Stable on no meds.      Relevant Orders   EKG 12-Lead (Completed)            Dispo: Return in about 6 months (around 11/04/2023).  Total time spent: 21 min spent with patient + 15 min spent charting = 36 min      Signed, Marykay Lex, MD, MS Bryan Lemma, M.D., M.S. Interventional Cardiologist  [facility]  Pager # [phone] Phone # [phone] [address] [place], [place] [address]

## 2023-05-07 ENCOUNTER — Encounter: Payer: Self-pay | Admitting: Cardiology

## 2023-05-07 ENCOUNTER — Ambulatory Visit: Payer: Medicare HMO | Attending: Cardiology | Admitting: Cardiology

## 2023-05-07 VITALS — BP 128/84 | HR 73 | Ht 74.0 in | Wt 218.2 lb

## 2023-05-07 DIAGNOSIS — R931 Abnormal findings on diagnostic imaging of heart and coronary circulation: Secondary | ICD-10-CM | POA: Diagnosis not present

## 2023-05-07 DIAGNOSIS — E785 Hyperlipidemia, unspecified: Secondary | ICD-10-CM | POA: Diagnosis not present

## 2023-05-07 DIAGNOSIS — I351 Nonrheumatic aortic (valve) insufficiency: Secondary | ICD-10-CM | POA: Diagnosis not present

## 2023-05-07 DIAGNOSIS — R030 Elevated blood-pressure reading, without diagnosis of hypertension: Secondary | ICD-10-CM

## 2023-05-07 NOTE — Patient Instructions (Signed)
Medication Instructions:   No changes  *If you need a refill on your cardiac medications before your next appointment, please call your pharmacy*   Lab Work:  Not needed Please have your primary provider send a copy of your labwork once they are completed in Feb 2025    Testing/Procedures: Will be schedule in August of 2025  at [facility] [address] Your physician has requested that you have an echocardiogram. Echocardiography is a painless test that uses sound waves to create images of your heart. It provides your doctor with information about the size and shape of your heart and how well your heart's chambers and valves are working. This procedure takes approximately one hour. There are no restrictions for this procedure. Please do NOT wear cologne, perfume, aftershave, or lotions (deodorant is allowed). Please arrive 15 minutes prior to your appointment time.    Follow-Up: At [facility], you and your health needs are our priority.  As part of our continuing mission to provide you with exceptional heart care, we have created designated Provider Care Teams.  These Care Teams include your primary Cardiologist (physician) and Advanced Practice Providers (APPs -  Physician Assistants and Nurse Practitioners) who all work together to provide you with the care you need, when you need it.     Your next appointment:   12 month(s)after Echo is completed Aug 2025   The format for your next appointment:   In Person  Provider:   Bryan Lemma, MD

## 2023-05-14 ENCOUNTER — Encounter: Payer: Self-pay | Admitting: Cardiology

## 2023-05-14 NOTE — Assessment & Plan Note (Signed)
Blood pressure looks good today.  Stable on no meds.

## 2023-05-14 NOTE — Assessment & Plan Note (Addendum)
Due for follow-up evaluation.

## 2023-05-14 NOTE — Assessment & Plan Note (Signed)
Significant elevated Coronary Calcium Score.  Still somewhat asymptomatic and very energetic.  No plans for full ischemic evaluation in the absence of symptoms.  Continue to treat risk factors: Blood pressure seems controlled Keep an eye on lipids, LDL was 66 total cholesterol 124 on combination of statin and krill oil.  Plan to continue this for at least another year or so. Keep on 81 mg aspirin.

## 2023-05-14 NOTE — Assessment & Plan Note (Signed)
Due for labs to be checked now.  This important to assess symptoms, dosing and complaints.  Continue rosuvastatin

## 2023-06-20 DIAGNOSIS — Z23 Encounter for immunization: Secondary | ICD-10-CM | POA: Diagnosis not present

## 2023-10-05 DIAGNOSIS — I2510 Atherosclerotic heart disease of native coronary artery without angina pectoris: Secondary | ICD-10-CM | POA: Diagnosis not present

## 2023-10-05 DIAGNOSIS — Z125 Encounter for screening for malignant neoplasm of prostate: Secondary | ICD-10-CM | POA: Diagnosis not present

## 2023-10-05 DIAGNOSIS — E7800 Pure hypercholesterolemia, unspecified: Secondary | ICD-10-CM | POA: Diagnosis not present

## 2023-10-12 DIAGNOSIS — Z1331 Encounter for screening for depression: Secondary | ICD-10-CM | POA: Diagnosis not present

## 2023-10-12 DIAGNOSIS — I359 Nonrheumatic aortic valve disorder, unspecified: Secondary | ICD-10-CM | POA: Diagnosis not present

## 2023-10-12 DIAGNOSIS — M160 Bilateral primary osteoarthritis of hip: Secondary | ICD-10-CM | POA: Diagnosis not present

## 2023-10-12 DIAGNOSIS — J309 Allergic rhinitis, unspecified: Secondary | ICD-10-CM | POA: Diagnosis not present

## 2023-10-12 DIAGNOSIS — E7800 Pure hypercholesterolemia, unspecified: Secondary | ICD-10-CM | POA: Diagnosis not present

## 2023-10-12 DIAGNOSIS — Z1339 Encounter for screening examination for other mental health and behavioral disorders: Secondary | ICD-10-CM | POA: Diagnosis not present

## 2023-10-12 DIAGNOSIS — F5221 Male erectile disorder: Secondary | ICD-10-CM | POA: Diagnosis not present

## 2023-10-12 DIAGNOSIS — M67431 Ganglion, right wrist: Secondary | ICD-10-CM | POA: Diagnosis not present

## 2023-10-12 DIAGNOSIS — E669 Obesity, unspecified: Secondary | ICD-10-CM | POA: Diagnosis not present

## 2023-10-12 DIAGNOSIS — I2510 Atherosclerotic heart disease of native coronary artery without angina pectoris: Secondary | ICD-10-CM | POA: Diagnosis not present

## 2023-10-12 DIAGNOSIS — Z0000 Encounter for general adult medical examination without abnormal findings: Secondary | ICD-10-CM | POA: Diagnosis not present

## 2023-10-12 DIAGNOSIS — D126 Benign neoplasm of colon, unspecified: Secondary | ICD-10-CM | POA: Diagnosis not present

## 2023-10-12 DIAGNOSIS — R82998 Other abnormal findings in urine: Secondary | ICD-10-CM | POA: Diagnosis not present

## 2024-01-15 DIAGNOSIS — L820 Inflamed seborrheic keratosis: Secondary | ICD-10-CM | POA: Diagnosis not present

## 2024-01-15 DIAGNOSIS — L821 Other seborrheic keratosis: Secondary | ICD-10-CM | POA: Diagnosis not present

## 2024-01-15 DIAGNOSIS — C44219 Basal cell carcinoma of skin of left ear and external auricular canal: Secondary | ICD-10-CM | POA: Diagnosis not present

## 2024-01-15 DIAGNOSIS — L814 Other melanin hyperpigmentation: Secondary | ICD-10-CM | POA: Diagnosis not present

## 2024-01-15 DIAGNOSIS — L218 Other seborrheic dermatitis: Secondary | ICD-10-CM | POA: Diagnosis not present

## 2024-02-06 DIAGNOSIS — C44219 Basal cell carcinoma of skin of left ear and external auricular canal: Secondary | ICD-10-CM | POA: Diagnosis not present

## 2024-03-10 ENCOUNTER — Encounter: Payer: Self-pay | Admitting: Internal Medicine

## 2024-03-24 ENCOUNTER — Ambulatory Visit (HOSPITAL_COMMUNITY)
Admission: RE | Admit: 2024-03-24 | Discharge: 2024-03-24 | Disposition: A | Discharge status: Home / Self Care | Payer: Medicare HMO | Source: Ambulatory Visit | Attending: Cardiology | Admitting: Cardiology

## 2024-03-24 DIAGNOSIS — R931 Abnormal findings on diagnostic imaging of heart and coronary circulation: Secondary | ICD-10-CM | POA: Diagnosis not present

## 2024-03-24 DIAGNOSIS — I351 Nonrheumatic aortic (valve) insufficiency: Secondary | ICD-10-CM | POA: Insufficient documentation

## 2024-03-24 LAB — ECHOCARDIOGRAM COMPLETE
AR max vel: 1.4 cm2
AV Area VTI: 1.52 cm2
AV Area mean vel: 1.37 cm2
AV Mean grad: 13.8 mmHg
AV Peak grad: 22.2 mmHg
Ao pk vel: 2.36 m/s
Area-P 1/2: 3.53 cm2
S' Lateral: 2.2 cm

## 2024-03-25 ENCOUNTER — Ambulatory Visit: Payer: Self-pay | Admitting: Cardiology

## 2024-04-04 ENCOUNTER — Telehealth: Payer: Self-pay | Admitting: *Deleted

## 2024-04-04 NOTE — Telephone Encounter (Signed)
 Hi John,  Can you review this patient's echo and advise/  Thanks, Damien

## 2024-04-23 ENCOUNTER — Other Ambulatory Visit: Payer: Self-pay

## 2024-04-23 DIAGNOSIS — I351 Nonrheumatic aortic (valve) insufficiency: Secondary | ICD-10-CM

## 2024-04-25 DIAGNOSIS — H524 Presbyopia: Secondary | ICD-10-CM | POA: Diagnosis not present

## 2024-04-25 DIAGNOSIS — H1789 Other corneal scars and opacities: Secondary | ICD-10-CM | POA: Diagnosis not present

## 2024-04-25 DIAGNOSIS — H26491 Other secondary cataract, right eye: Secondary | ICD-10-CM | POA: Diagnosis not present

## 2024-04-25 DIAGNOSIS — H43813 Vitreous degeneration, bilateral: Secondary | ICD-10-CM | POA: Diagnosis not present

## 2024-04-25 DIAGNOSIS — H52203 Unspecified astigmatism, bilateral: Secondary | ICD-10-CM | POA: Diagnosis not present

## 2024-04-28 ENCOUNTER — Encounter: Payer: Self-pay | Admitting: Internal Medicine

## 2024-04-28 ENCOUNTER — Ambulatory Visit (AMBULATORY_SURGERY_CENTER): Admitting: *Deleted

## 2024-04-28 VITALS — Ht 73.0 in | Wt 220.0 lb

## 2024-04-28 DIAGNOSIS — Z860100 Personal history of colon polyps, unspecified: Secondary | ICD-10-CM

## 2024-04-28 MED ORDER — NA SULFATE-K SULFATE-MG SULF 17.5-3.13-1.6 GM/177ML PO SOLN: 1.0000 | Freq: Once | ORAL | 0 refills | Status: AC

## 2024-04-28 NOTE — Progress Notes (Signed)
 Pt's name and DOB verified at the beginning of the pre-visit with 2 identifiers  Pt denies any difficulty with ambulating,sitting, laying down or rolling side to side  Pt has no issues moving head neck or swallowing  No egg or soy allergy known to patient   No issues known to pt with past sedation  No FH of Malignant Hyperthermia  Pt is not on home 02   Pt is not on blood thinners   Pt denies issues with constipation   Pt is not on dialysis  Pt heart murmur  Pt denies any upcoming cardiac testing  Patient's chart reviewed by Norleen Schillings CNRA prior to pre-visit and patient appropriate for the [facility].  Pre-visit completed and red dot placed by patient's name on their procedure day (on provider's schedule).    Visit by phone  Pt states weight is 220 lb   Pt given  both [facility] main # and MD on call # prior to instructions.  Informed pt to come in at the time discussed and is shown on PV instructions.  Pt instructed to use Single[employer] or GoodRx for a price reduction on prep  Instructed pt where to find PV instructions in My Chart  Instructed pt on all aspects of written instructions including med holds clothing to wear and foods to eat and not eat as well as after procedure legal restrictions and to call MD on call if needed.. Pt states understanding. Instructed pt to review instructions again prior to procedure and call main # given if has any questions or any issues. Pt states they will.

## 2024-05-05 ENCOUNTER — Ambulatory Visit: Attending: Cardiology | Admitting: Cardiology

## 2024-05-05 VITALS — BP 122/72 | HR 77 | Ht 74.0 in | Wt 223.0 lb

## 2024-05-05 DIAGNOSIS — E785 Hyperlipidemia, unspecified: Secondary | ICD-10-CM

## 2024-05-05 DIAGNOSIS — R030 Elevated blood-pressure reading, without diagnosis of hypertension: Secondary | ICD-10-CM | POA: Diagnosis not present

## 2024-05-05 DIAGNOSIS — R931 Abnormal findings on diagnostic imaging of heart and coronary circulation: Secondary | ICD-10-CM | POA: Diagnosis not present

## 2024-05-05 DIAGNOSIS — I350 Nonrheumatic aortic (valve) stenosis: Secondary | ICD-10-CM | POA: Diagnosis not present

## 2024-05-05 NOTE — Patient Instructions (Addendum)
 Medication Instructions:   No changes *If you need a refill on your cardiac medications before your next appointment, please call your pharmacy*   Lab Work: Not needed    Testing/Procedures: 1) schedule in Aug 2026 Your physician has requested that you have an echocardiogram. Echocardiography is a painless test that uses sound waves to create images of your heart. It provides your doctor with information about the size and shape of your heart and how well your heart's chambers and valves are working. This procedure takes approximately one hour. There are no restrictions for this procedure. Please do NOT wear cologne, perfume, aftershave, or lotions (deodorant is allowed). Please arrive 15 minutes prior to your appointment time.  Please note: We ask at that you not bring children with you during ultrasound (echo/ vascular) testing. Due to room size and safety concerns, children are not allowed in the ultrasound rooms during exams. Our front office staff cannot provide observation of children in our lobby area while testing is being conducted. An adult accompanying a patient to their appointment will only be allowed in the ultrasound room at the discretion of the ultrasound technician under special circumstances. We apologize for any inconvenience.    Follow-Up: At [facility], you and your health needs are our priority.  As part of our continuing mission to provide you with exceptional heart care, we have created designated Provider Care Teams.  These Care Teams include your primary Cardiologist (physician) and Advanced Practice Providers (APPs -  Physician Assistants and Nurse Practitioners) who all work together to provide you with the care you need, when you need it.     Your next appointment:   12 month(s)  The format for your next appointment:   In Person  Provider:   Alm Clay, MD

## 2024-05-05 NOTE — Progress Notes (Signed)
 Cardiology Office Note:  .   Date:  05/09/2024  ID:  Carlos [name], DOB 10-07-1945, MRN [id number] PCP: Vernadine Charlie ORN, MD   HeartCare Providers Cardiologist:  Alm Clay, MD     Chief Complaint  Patient presents with   Follow-up    Doing well.  No symptoms.  Here to discuss test result   Cardiac Valve Problem    Echo suggest possible moderate AS.   Coronary Artery Disease    Elevated CAC score but no angina.    Patient Profile: .     Carlos [name] is a 78 y.o. male  with a PMH notable for elevated CAC score (718), HLD and Moderate AS who presents here for delayed annual follow-up.  He returns at the request of Tisovec, Charlie ORN, MD.     Carlos [name] was last seen on 05/07/2023 for delayed annual follow-up.  He was doing well.  Unfortunately, he had gotten out of the habit of exercise since having his cataract surgery.  Still doing his stretching on a daily basis and walking but not doing his other exercises at the gym.  He said he just got to busy .  No real change in symptoms after taking the full 20 mg atorvastatin.  No anginal symptoms.  No heart failure symptoms.  Subjective  Discussed the use of AI scribe software for clinical note transcription with the patient, who gave verbal consent to proceed.  History of Present Illness Carlos [name] is a 78 year old male with aortic stenosis who presents for follow-up of his condition.  He remains active, regularly walking and climbing stairs, averaging around 7,000 steps daily. He has not returned to the gym due to personal commitments but continues physical activities at home and church. No chest tightness, pressure, pain, heart racing, skipping, or shortness of breath during these activities. He also experiences no dizziness, wooziness, or syncope.  He has a history of aortic stenosis and recently underwent an echocardiogram. He currently reports no symptoms such as shortness of  breath, chest pain, or syncope.  Recent lab results from February show a total cholesterol of 126, triglycerides of 129, HDL of 42, and LDL of 58. He is on rosuvastatin  20 mg, which he tolerates well after adjusting from a previous dose that caused gastrointestinal issues. He also takes krill oil and aspirin , though not daily, as he was advised to reduce aspirin  intake due to previous diarrhea issues.  He denies any recent illnesses, fevers, chills, colds, or sweats. He also denies stroke-like symptoms, blood in stool, or urine issues. He reports occasional cramps in his calves or thighs, which are relieved by stretching and do not impede his activities.  Cardiovascular ROS: no chest pain or dyspnea on exertion negative for - edema, irregular heartbeat, murmur, orthopnea, palpitations, paroxysmal nocturnal dyspnea, rapid heart rate, shortness of breath, or lightheadedness, dizziness or wooziness, syncope or near STEMI, TIA or amaurosis fugax, claudication  ROS:  Review of Systems - negative for not reviewed above.    Objective   Current Meds   aspirin  EC 81 MG tablet rosuvastatin  (CRESTOR ) 20 MG tablet Take 1 tablet (81 mg total) by mouth daily. (Patient taking differently: Take 81 mg by mouth as directed. Every other day) Take 20 mg by mouth daily.    Medication Sig   Ascorbic Acid (VITAMIN C) 1000 MG tablet Take 1,000 mg by mouth daily.   betamethasone  dipropionate 0.05 % cream Apply topically 2 (two) times  daily as needed (Rash).   Cholecalciferol (VITAMIN D-3 PO) Take by mouth.   Coenzyme Q10-Vitamin E (QUNOL ULTRA COQ10 PO) Take by mouth.   Fluocinolone  Acetonide 0.01 % OIL Place 1 application in ear(s) every morning. (Patient taking differently: Place 1 application  in ear(s) as needed.)   Fluocinolone  Acetonide Body 0.01 % OIL Apply to inner ear qd   fluticasone (FLONASE) 50 MCG/ACT nasal spray Place 2 sprays into both nostrils daily. (Patient taking differently: Place 2 sprays  into both nostrils as needed.)   multivitamin-lutein (OCUVITE-LUTEIN) CAPS capsule Take 2 capsules by mouth daily.   Oral Electrolytes (LIQUID I.V. PO) Take by mouth daily at 12 noon.   sildenafil (VIAGRA) 50 MG tablet Take 50 mg by mouth daily as needed for erectile dysfunction.   TURMERIC PO Take by mouth.     Studies Reviewed: SABRA   EKG Interpretation Date/Time:  Monday May 05 2024 09:38:38 EDT Ventricular Rate:  77 PR Interval:  194 QRS Duration:  82 QT Interval:  364 QTC Calculation: 411 R Axis:   -7  Text Interpretation: Normal sinus rhythm Normal ECG When compared with ECG of 07-May-2023 08:20, Premature ventricular complexes are no longer Present Confirmed by Anner Lenis ([id number]) on 05/05/2024 9:43:07 AM    No results found for: CHOL, HDL, LDLCALC, LDLDIRECT, TRIG, CHOLHDL Results LABS(10/05/2023): TC 126, TG 129, HDL 42, LDL 58; Glu 101,Cr 1.0, Na+ 146, K+ 4.5; AST 21, ALT 28. WBC 7 point DES, Hgb 13.8, PLT 201 Checked by PCP  DIAGNOSTIC Echocardiogram (03/24/2024): EF 60 to 65% with no RWMA.  GR 1 DD.  Elevated LAP.  Normal LA and RA size.  Normal RV size and function.  Unable to assess PAP.  Severe AoV calcification.  Mild AI.  Mild AS by gradients but more consistent with moderate aortic stenosis => no significant change in mean AVG compared to 02/2021 Echo 03/03/2021: EF 60 to 65%.  Severe aortic valve calcification with mild AS. CAC (01/13/2022): Total score 718: LAD 408, LCx 183, RCA 127.  Also noted AoV calcification.    Risk Assessment/Calculations:      Physical Exam:   VS:  BP 122/72 (BP Location: Right Arm, Patient Position: Sitting, Cuff Size: Normal)   Pulse 77   Ht 6' 2 (1.88 m)   Wt 223 lb (101.2 kg)   SpO2 99%   BMI 28.63 kg/m    Wt Readings from Last 3 Encounters:  05/05/24 223 lb (101.2 kg)  04/28/24 220 lb (99.8 kg)  05/07/23 218 lb 3.2 oz (99 kg)      GEN: Healthy appearing.  Well nourished, well groomed in no acute distress;   NECK: No JVD; No carotid bruits CARDIAC:  RRR, Normal S1, S2; 2/6 SEM at RUSB-neck.  Rubs, gallops RESPIRATORY:  Clear to auscultation without rales, wheezing or rhonchi ; nonlabored, good air movement. ABDOMEN: Soft, non-tender, non-distended EXTREMITIES:  No edema; No deformity      ASSESSMENT AND PLAN: .    Problem List Items Addressed This Visit       Cardiology Problems   Agatston coronary artery calcium  score greater than 400 (Chronic)   Significant CAC elevation over 700 but with no active anginal symptoms.  He is pretty active and doing well. For now, no need for ischemic evaluation.  If his AS were to progress to significant stenosis, he would then need ischemic evaluation prior to consideration for TAVR.  Otherwise would hold off on ischemic evaluation in the absence of symptoms.  Discussed angina and heart failure symptoms -> he is doing well with no symptoms. Recommend aggressive CRF modification: Continue 81 mg aspirin  for prophylaxis. He is at goal LDL<70 with most recent lab showing LDL of 58 Continue rosuvastatin  20 mg daily.  Tolerating well. Blood pressure is well-controlled without medications      Hyperlipidemia with target LDL less than 70 (Chronic)   Well-controlled with rosuvastatin  20 mg daily. Lipid panel satisfactory. - Continue rosuvastatin  20 mg daily. - Encourage regular physical activity.      Moderate calcific aortic stenosis - Primary (Chronic)   Completely asymptomatic.  Difficult to tell if this is true progression of disease or not. Moderate stenosis progression noted over three years, currently asymptomatic. Echocardiogram shows low moderate stenosis. - Order annual echocardiogram to monitor stenosis. - Educated on symptoms of worsening stenosis: shortness of breath, chest pain, syncope. - Discussed potential TAVR intervention if symptoms develop or condition progresses.  Based on recommendations, would reassess Echocardiogram June of next  year.      Relevant Orders   ECHOCARDIOGRAM COMPLETE     Other   Elevated blood pressure reading without diagnosis of hypertension (Chronic)   Not currently on medications.  BP looks better today.  Monitor.      Relevant Orders   EKG 12-Lead (Completed)           Follow-Up: Return in about 1 year (around 05/05/2025) for [employer] -> after follow-up echocardiogram.      Signed, Alm MICAEL Clay, MD, MS Alm Clay, M.D., M.S. Interventional Cardiologist  [facility] Pager # [phone]

## 2024-05-05 NOTE — Assessment & Plan Note (Signed)
   Based on recommendations, would reassess Echocardiogram June of next year. Discussed red flag symptoms of angina, CHF and syncope.

## 2024-05-05 NOTE — Progress Notes (Unsigned)
 EKG

## 2024-05-08 ENCOUNTER — Ambulatory Visit: Admitting: Cardiology

## 2024-05-09 ENCOUNTER — Encounter: Payer: Self-pay | Admitting: Cardiology

## 2024-05-09 NOTE — Assessment & Plan Note (Signed)
 Well-controlled with rosuvastatin  20 mg daily. Lipid panel satisfactory. - Continue rosuvastatin  20 mg daily. - Encourage regular physical activity.

## 2024-05-09 NOTE — Assessment & Plan Note (Signed)
 Significant CAC elevation over 700 but with no active anginal symptoms.  He is pretty active and doing well. For now, no need for ischemic evaluation.  If his AS were to progress to significant stenosis, he would then need ischemic evaluation prior to consideration for TAVR.  Otherwise would hold off on ischemic evaluation in the absence of symptoms. Discussed angina and heart failure symptoms -> he is doing well with no symptoms. Recommend aggressive CRF modification: Continue 81 mg aspirin  for prophylaxis. He is at goal LDL<70 with most recent lab showing LDL of 58 Continue rosuvastatin  20 mg daily.  Tolerating well. Blood pressure is well-controlled without medications

## 2024-05-09 NOTE — Assessment & Plan Note (Signed)
 Not currently on medications.  BP looks better today.  Monitor.

## 2024-05-12 ENCOUNTER — Ambulatory Visit: Admitting: Internal Medicine

## 2024-05-12 ENCOUNTER — Encounter: Payer: Self-pay | Admitting: Internal Medicine

## 2024-05-12 VITALS — BP 119/65 | HR 67 | Temp 98.8°F | Resp 12 | Ht 73.0 in | Wt 220.0 lb

## 2024-05-12 DIAGNOSIS — Z1211 Encounter for screening for malignant neoplasm of colon: Secondary | ICD-10-CM | POA: Diagnosis not present

## 2024-05-12 DIAGNOSIS — K5730 Diverticulosis of large intestine without perforation or abscess without bleeding: Secondary | ICD-10-CM

## 2024-05-12 DIAGNOSIS — I2510 Atherosclerotic heart disease of native coronary artery without angina pectoris: Secondary | ICD-10-CM | POA: Diagnosis not present

## 2024-05-12 DIAGNOSIS — Z860101 Personal history of adenomatous and serrated colon polyps: Secondary | ICD-10-CM

## 2024-05-12 DIAGNOSIS — Z860100 Personal history of colon polyps, unspecified: Secondary | ICD-10-CM

## 2024-05-12 DIAGNOSIS — K648 Other hemorrhoids: Secondary | ICD-10-CM | POA: Diagnosis not present

## 2024-05-12 DIAGNOSIS — I10 Essential (primary) hypertension: Secondary | ICD-10-CM | POA: Diagnosis not present

## 2024-05-12 DIAGNOSIS — R030 Elevated blood-pressure reading, without diagnosis of hypertension: Secondary | ICD-10-CM | POA: Diagnosis not present

## 2024-05-12 DIAGNOSIS — E785 Hyperlipidemia, unspecified: Secondary | ICD-10-CM | POA: Diagnosis not present

## 2024-05-12 MED ORDER — SODIUM CHLORIDE 0.9 % IV SOLN: 500.0000 mL | Freq: Once | INTRAVENOUS | Status: DC

## 2024-05-12 NOTE — Op Note (Signed)
 [facility] Patient Name: Carlos [name] Procedure Date: 05/12/2024 11:02 AM MRN: [id number] Endoscopist: Norleen SAILOR. Abran , MD, [id number] Age: 78 Referring MD:  Date of Birth: [birth year] Gender: Male Account #: [id number] Procedure:                Colonoscopy Indications:              High risk colon cancer surveillance: Personal                            history of multiple (3 or more) adenomas. Son with                            a history of colon cancer. Previous examinations                            2006, 2008, 2011, 2014, 2020 Medicines:                Monitored Anesthesia Care Procedure:                Pre-Anesthesia Assessment:                           - Prior to the procedure, a History and Physical                            was performed, and patient medications and                            allergies were reviewed. The patient's tolerance of                            previous anesthesia was also reviewed. The risks                            and benefits of the procedure and the sedation                            options and risks were discussed with the patient.                            All questions were answered, and informed consent                            was obtained. Prior Anticoagulants: The patient has                            taken no anticoagulant or antiplatelet agents. ASA                            Grade Assessment: II - A patient with mild systemic                            disease. After reviewing the risks and benefits,  the patient was deemed in satisfactory condition to                            undergo the procedure.                           After obtaining informed consent, the colonoscope                            was passed under direct vision. Throughout the                            procedure, the patient's blood pressure, pulse, and                            oxygen saturations were monitored  continuously. The                            Olympus Scope SN: [id number] was introduced through                            the anus and advanced to the the cecum, identified                            by appendiceal orifice and ileocecal valve. The                            ileocecal valve, appendiceal orifice, and rectum                            were photographed. The quality of the bowel                            preparation was good. The colonoscopy was performed                            without difficulty. The patient tolerated the                            procedure well. The bowel preparation used was                            SUPREP via split dose instruction. Scope In: 11:15:47 AM Scope Out: 11:29:51 AM Scope Withdrawal Time: 0 hours 9 minutes 51 seconds  Total Procedure Duration: 0 hours 14 minutes 4 seconds  Findings:                 Many diverticula were found in the left colon.                           Internal hemorrhoids were found during retroflexion.                           The exam was otherwise without abnormality on  direct and retroflexion views. Complications:            No immediate complications. Estimated blood loss:                            None. Estimated Blood Loss:     Estimated blood loss: none. Impression:               - Diverticulosis in the left colon.                           - Internal hemorrhoids.                           - The examination was otherwise normal on direct                            and retroflexion views.                           - No specimens collected. Recommendation:           - Repeat colonoscopy is not recommended for                            surveillance.                           - Patient has a contact number available for                            emergencies. The signs and symptoms of potential                            delayed complications were discussed with the                             patient. Return to normal activities tomorrow.                            Written discharge instructions were provided to the                            patient.                           - Resume previous diet.                           - Continue present medications. Norleen SAILOR. Abran, MD 05/12/2024 11:37:52 AM This report has been signed electronically.

## 2024-05-12 NOTE — Progress Notes (Signed)
 HISTORY OF PRESENT ILLNESS:  Carlos [name] is a 78 y.o. male with a personal history of multiple adenomatous colon polyps.  Multiple prior colonoscopies.  Last exam 2020.  Now for follow-up  REVIEW OF SYSTEMS:  All non-GI ROS negative except for  Past Medical History:  Diagnosis Date   Arthritis    Cataract    Coronary artery disease 12/2020   Coronary calcium  score of 718   Erectile dysfunction    Heart murmur    Hx of adenomatous colonic polyps 11/08/2018   Dr. Abran   Hyperlipidemia    Hypertension    Osteoarthritis of bilateral glenohumeral joints    Also both knees   Seasonal allergies    White coat syndrome with diagnosis of hypertension     Past Surgical History:  Procedure Laterality Date   COLONOSCOPY  05/13/2013   10/2018 - 2 polyps   HERNIA REPAIR Right 1997   POLYPECTOMY     TRANSTHORACIC ECHOCARDIOGRAM  02/2021   EF 60 to 65%.  No R WMA.  GR 1 DD.  Unable to fully assess RV pressures.  Normal function.  Sever AoV thickening with mild AR (mean AOV gradient 12.4 mmHg -> estimated AVA by VTI 1.60 cm)    Social History Carlos [name]  reports that he has never smoked. He has never used smokeless tobacco. He reports current alcohol  use. He reports that he does not use drugs.  family history includes COPD in his mother; Colon cancer (age of onset: 64) in his son; Gout in his father; Hypertension in his father; Kidney Stones in his brother and brother; Osteoarthritis in his brother; Other in his mother; Pneumonia in his father.  No Known Allergies     PHYSICAL EXAMINATION: Vital signs: BP (!) 144/76   Pulse 78   Temp 98.8 F (37.1 C) (Temporal)   Ht 6' 1 (1.854 m)   Wt 220 lb (99.8 kg)   SpO2 98%   BMI 29.03 kg/m  General: Well-developed, well-nourished, no acute distress HEENT: Sclerae are anicteric, conjunctiva pink. Oral mucosa intact Lungs: Clear Heart: Regular Abdomen: soft, nontender, nondistended, no obvious ascites, no peritoneal  signs, normal bowel sounds. No organomegaly. Extremities: No edema Psychiatric: alert and oriented x3. Cooperative     ASSESSMENT:  Personal history of multiple adenomatous colon polyps   PLAN:  Surveillance colonoscopy

## 2024-05-12 NOTE — Patient Instructions (Signed)
 Educational handout provided to patient related to Hemorrhoids and Diverticulosis  Resume previous diet  Continue present medications  Repeat colonoscopy is NOT recommended for surveillance  YOU HAD AN ENDOSCOPIC PROCEDURE TODAY AT THE [facility]:   Refer to the procedure report that was given to you for any specific questions about what was found during the examination.  If the procedure report does not answer your questions, please call your gastroenterologist to clarify.  If you requested that your care partner not be given the details of your procedure findings, then the procedure report has been included in a sealed envelope for you to review at your convenience later.  YOU SHOULD EXPECT: Some feelings of bloating in the abdomen. Passage of more gas than usual.  Walking can help get rid of the air that was put into your GI tract during the procedure and reduce the bloating. If you had a lower endoscopy (such as a colonoscopy or flexible sigmoidoscopy) you may notice spotting of blood in your stool or on the toilet paper. If you underwent a bowel prep for your procedure, you may not have a normal bowel movement for a few days.  Please Note:  You might notice some irritation and congestion in your nose or some drainage.  This is from the oxygen used during your procedure.  There is no need for concern and it should clear up in a day or so.  SYMPTOMS TO REPORT IMMEDIATELY:  Following lower endoscopy (colonoscopy or flexible sigmoidoscopy):  Excessive amounts of blood in the stool  Significant tenderness or worsening of abdominal pains  Swelling of the abdomen that is new, acute  Fever of 100F or higher  For urgent or emergent issues, a gastroenterologist can be reached at any hour by calling [phone]. Do not use MyChart messaging for urgent concerns.    DIET:  We do recommend a small meal at first, but then you may proceed to your regular diet.  Drink plenty of fluids  but you should avoid alcoholic beverages for 24 hours.  ACTIVITY:  You should plan to take it easy for the rest of today and you should NOT DRIVE or use heavy machinery until tomorrow (because of the sedation medicines used during the test).    FOLLOW UP: Our staff will call the number listed on your records the next business day following your procedure.  We will call around 7:15- 8:00 am to check on you and address any questions or concerns that you may have regarding the information given to you following your procedure. If we do not reach you, we will leave a message.     If any biopsies were taken you will be contacted by phone or by letter within the next 1-3 weeks.  Please call us at [phone] if you have not heard about the biopsies in 3 weeks.    SIGNATURES/CONFIDENTIALITY: You and/or your care partner have signed paperwork which will be entered into your electronic medical record.  These signatures attest to the fact that that the information above on your After Visit Summary has been reviewed and is understood.  Full responsibility of the confidentiality of this discharge information lies with you and/or your care-partner.

## 2024-05-12 NOTE — Progress Notes (Signed)
 Sedate, gd SR, tolerated procedure well, VSS, report to RN

## 2024-05-13 ENCOUNTER — Telehealth: Payer: Self-pay

## 2024-05-13 NOTE — Telephone Encounter (Signed)
  Follow up Call-     05/12/2024   10:23 AM  Call back number  Post procedure Call Back phone  # [phone]  Permission to leave phone message Yes     Patient questions:  Do you have a fever, pain , or abdominal swelling? No. Pain Score  0 *  Have you tolerated food without any problems? Yes.    Have you been able to return to your normal activities? Yes.    Do you have any questions about your discharge instructions: Diet   No. Medications  No. Follow up visit  No.  Do you have questions or concerns about your Care? No.  Actions: * If pain score is 4 or above: No action needed, pain <4.

## 2025-03-26 ENCOUNTER — Other Ambulatory Visit (HOSPITAL_COMMUNITY)
